# Patient Record
Sex: Female | Born: 1945 | Hispanic: No | Smoking: Never smoker
Health system: Southern US, Community
[De-identification: ages and names within clinical notes are randomized; demographics above are authoritative.]

## PROBLEM LIST (undated history)

## (undated) DIAGNOSIS — G4733 Obstructive sleep apnea (adult) (pediatric): Secondary | ICD-10-CM

## (undated) DIAGNOSIS — F329 Major depressive disorder, single episode, unspecified: Secondary | ICD-10-CM

## (undated) DIAGNOSIS — K219 Gastro-esophageal reflux disease without esophagitis: Secondary | ICD-10-CM

## (undated) DIAGNOSIS — I10 Essential (primary) hypertension: Secondary | ICD-10-CM

## (undated) DIAGNOSIS — F419 Anxiety disorder, unspecified: Secondary | ICD-10-CM

## (undated) DIAGNOSIS — R42 Dizziness and giddiness: Secondary | ICD-10-CM

## (undated) DIAGNOSIS — M5430 Sciatica, unspecified side: Secondary | ICD-10-CM

## (undated) DIAGNOSIS — H8109 Meniere's disease, unspecified ear: Secondary | ICD-10-CM

## (undated) DIAGNOSIS — F32A Depression, unspecified: Secondary | ICD-10-CM

## (undated) DIAGNOSIS — I2699 Other pulmonary embolism without acute cor pulmonale: Secondary | ICD-10-CM

## (undated) DIAGNOSIS — IMO0001 Reserved for inherently not codable concepts without codable children: Secondary | ICD-10-CM

## (undated) DIAGNOSIS — D759 Disease of blood and blood-forming organs, unspecified: Secondary | ICD-10-CM

## (undated) DIAGNOSIS — T4145XA Adverse effect of unspecified anesthetic, initial encounter: Secondary | ICD-10-CM

## (undated) DIAGNOSIS — M1990 Unspecified osteoarthritis, unspecified site: Secondary | ICD-10-CM

## (undated) DIAGNOSIS — T8859XA Other complications of anesthesia, initial encounter: Secondary | ICD-10-CM

## (undated) HISTORY — PX: FOOT SURGERY: SHX648

## (undated) HISTORY — PX: ABDOMINAL HYSTERECTOMY: SHX81

## (undated) HISTORY — PX: EYE SURGERY: SHX253

## (undated) HISTORY — PX: HAND SURGERY: SHX662

## (undated) HISTORY — PX: KNEE SURGERY: SHX244

## (undated) HISTORY — DX: Dizziness and giddiness: R42

## (undated) HISTORY — DX: Anxiety disorder, unspecified: F41.9

## (undated) HISTORY — PX: TONSILLECTOMY: SUR1361

## (undated) HISTORY — DX: Major depressive disorder, single episode, unspecified: F32.9

## (undated) HISTORY — DX: Meniere's disease, unspecified ear: H81.09

## (undated) HISTORY — DX: Unspecified osteoarthritis, unspecified site: M19.90

## (undated) HISTORY — DX: Obstructive sleep apnea (adult) (pediatric): G47.33

## (undated) HISTORY — DX: Depression, unspecified: F32.A

## (undated) HISTORY — DX: Gastro-esophageal reflux disease without esophagitis: K21.9

## (undated) HISTORY — PX: BOWEL RESECTION: SHX1257

---

## 2007-05-13 ENCOUNTER — Emergency Department (HOSPITAL_COMMUNITY): Admission: EM | Admit: 2007-05-13 | Discharge: 2007-05-13 | Payer: Self-pay | Admitting: Emergency Medicine

## 2007-05-23 ENCOUNTER — Ambulatory Visit (HOSPITAL_COMMUNITY): Admission: RE | Admit: 2007-05-23 | Discharge: 2007-05-23 | Payer: Self-pay | Admitting: Orthopedic Surgery

## 2007-06-25 ENCOUNTER — Encounter: Admission: RE | Admit: 2007-06-25 | Discharge: 2007-09-23 | Payer: Self-pay | Admitting: Orthopedic Surgery

## 2008-09-30 ENCOUNTER — Encounter: Admission: RE | Admit: 2008-09-30 | Discharge: 2008-09-30 | Payer: Self-pay | Admitting: Family Medicine

## 2009-09-10 ENCOUNTER — Encounter: Admission: RE | Admit: 2009-09-10 | Discharge: 2009-09-10 | Payer: Self-pay

## 2009-10-08 ENCOUNTER — Emergency Department (HOSPITAL_COMMUNITY): Admission: EM | Admit: 2009-10-08 | Discharge: 2009-10-08 | Payer: Self-pay | Admitting: Emergency Medicine

## 2009-10-10 ENCOUNTER — Emergency Department (HOSPITAL_COMMUNITY): Admission: EM | Admit: 2009-10-10 | Discharge: 2009-10-10 | Payer: Self-pay | Admitting: Emergency Medicine

## 2009-11-19 ENCOUNTER — Encounter: Admission: RE | Admit: 2009-11-19 | Discharge: 2009-11-19 | Payer: Self-pay | Admitting: Family Medicine

## 2010-01-11 ENCOUNTER — Ambulatory Visit: Payer: Self-pay | Admitting: Vascular Surgery

## 2010-01-11 ENCOUNTER — Emergency Department (HOSPITAL_COMMUNITY): Admission: EM | Admit: 2010-01-11 | Discharge: 2010-01-11 | Payer: Self-pay | Admitting: Emergency Medicine

## 2010-01-11 ENCOUNTER — Encounter (INDEPENDENT_AMBULATORY_CARE_PROVIDER_SITE_OTHER): Payer: Self-pay | Admitting: Emergency Medicine

## 2010-02-10 ENCOUNTER — Encounter: Admission: RE | Admit: 2010-02-10 | Discharge: 2010-02-10 | Payer: Self-pay | Admitting: Internal Medicine

## 2010-10-04 ENCOUNTER — Emergency Department (HOSPITAL_COMMUNITY): Payer: PRIVATE HEALTH INSURANCE

## 2010-10-04 ENCOUNTER — Emergency Department (HOSPITAL_COMMUNITY)
Admission: EM | Admit: 2010-10-04 | Discharge: 2010-10-05 | Disposition: A | Discharge status: Home / Self Care | Payer: PRIVATE HEALTH INSURANCE | Attending: Emergency Medicine | Admitting: Emergency Medicine

## 2010-10-04 DIAGNOSIS — M549 Dorsalgia, unspecified: Secondary | ICD-10-CM | POA: Insufficient documentation

## 2010-10-04 DIAGNOSIS — R0789 Other chest pain: Secondary | ICD-10-CM | POA: Insufficient documentation

## 2010-10-04 DIAGNOSIS — R42 Dizziness and giddiness: Secondary | ICD-10-CM | POA: Insufficient documentation

## 2010-10-04 DIAGNOSIS — M79609 Pain in unspecified limb: Secondary | ICD-10-CM | POA: Insufficient documentation

## 2010-10-04 DIAGNOSIS — I10 Essential (primary) hypertension: Secondary | ICD-10-CM | POA: Insufficient documentation

## 2010-10-04 LAB — BASIC METABOLIC PANEL
GFR calc Af Amer: >60 mL/min (ref >60)
GFR calc non Af Amer: >60 mL/min (ref >60)
Potassium: 4.4 mEq/L (ref 3.5–5.1)
Sodium: 137 mEq/L (ref 135–145)

## 2010-10-04 LAB — CK TOTAL AND CKMB (NOT AT ARMC)
CK, MB: 1.7 ng/mL (ref 0.3–4.0)
CK, MB: 1.6 ng/mL (ref 0.3–4.0)
CK, MB: 1.5 ng/mL (ref 0.3–4.0)
Relative Index: 1.4 (ref 0.0–2.5)
Relative Index: 1.3 (ref 0.0–2.5)
Total CK: 123 U/L (ref 7–177)
Total CK: 118 U/L (ref 7–177)

## 2010-10-04 LAB — CBC
MCH: 31.3 pg (ref 26.0–34.0)
MCHC: 34.1 g/dL (ref 30.0–36.0)
Platelets: 263 10*3/uL (ref 150–400)
RDW: 12.7 % (ref 11.5–15.5)
WBC: 5.9 10*3/uL (ref 4.0–10.5)

## 2010-10-04 LAB — URINALYSIS, ROUTINE W REFLEX MICROSCOPIC
Bilirubin Urine: NEGATIVE
Ketones, ur: NEGATIVE mg/dL
Nitrite: NEGATIVE
Specific Gravity, Urine: 1.013 (ref 1.005–1.030)
Urobilinogen, UA: 0.2 mg/dL (ref 0.0–1.0)
pH: 7 (ref 5.0–8.0)

## 2010-10-04 LAB — TROPONIN I: Troponin I: 0.02 ng/mL (ref 0.00–0.06)

## 2010-10-05 DIAGNOSIS — R072 Precordial pain: Secondary | ICD-10-CM

## 2010-10-27 DIAGNOSIS — K219 Gastro-esophageal reflux disease without esophagitis: Secondary | ICD-10-CM | POA: Insufficient documentation

## 2010-10-28 ENCOUNTER — Telehealth (INDEPENDENT_AMBULATORY_CARE_PROVIDER_SITE_OTHER): Payer: Self-pay | Admitting: *Deleted

## 2010-10-28 ENCOUNTER — Institutional Professional Consult (permissible substitution): Payer: PRIVATE HEALTH INSURANCE | Admitting: Pulmonary Disease

## 2010-10-31 ENCOUNTER — Telehealth: Payer: Self-pay | Admitting: Pulmonary Disease

## 2010-11-01 ENCOUNTER — Telehealth: Payer: Self-pay | Admitting: *Deleted

## 2010-11-01 NOTE — Telephone Encounter (Signed)
Phone Note  Call from Patient Call back at [facility] Phone [phone]   Caller: Patient Call For: clance Summary of Call: pt has called sleep center to cancel the sleep study that's scheduled for tonight as she just found out her ins. won't cover this. she will be on medicare in july and will rsc then.  Initial call taken by: Tivis Ringer, CNA,  October 28, 2010 3:17 PM  Follow-up for Phone Call         Will send to Dr. Shelle Iron as Melanie Pittman  October 28, 2010 3:18 PM   Additional Follow-up for Phone Call  Additional follow up Details #1::        i have never seen this pt before, and have no idea who ordered the sleep study.   Additional Follow-up by: Barbaraann Share MD,  October 28, 2010 5:01 PM    Additional Follow-up for Phone Call  Additional follow up Details #2::     Pt was scheduled to see kc today but no showed--lmomtcb x1 [name]  October 28, 2010 5:05 PM  lmomtcb x2 [name]  October 31, 2010 5:52 PM  lmomtcb x3 will sign off message per protocol informed pt if needed [place] for anything else to call and let us know . pt has apt 11/29/10 [name]  November 01, 2010 6:03 PM

## 2010-11-04 LAB — POCT I-STAT, CHEM 8
Creatinine, Ser: 0.3 mg/dL — ABNORMAL LOW (ref 0.4–1.2)
Glucose, Bld: 88 mg/dL (ref 70–99)
Hemoglobin: 13.3 g/dL (ref 12.0–15.0)
TCO2: 26 mmol/L (ref 0–100)

## 2010-11-10 NOTE — Progress Notes (Signed)
Summary: cancelled sleep stud- no show apt--lmomtcb x3  Phone Note Call from Patient Call back at [facility] Phone [phone]   Caller: Patient Call For: clance Summary of Call: pt has called sleep center to cancel the sleep study that's scheduled for tonight as she just found out her ins. won't cover this. she will be on medicare in july and will rsc then.  Initial call taken by: Tivis Ringer, CNA,  October 28, 2010 3:17 PM  Follow-up for Phone Call        Will send to Dr. Shelle Iron as Melanie Pittman  October 28, 2010 3:18 PM   Additional Follow-up for Phone Call Additional follow up Details #1::        i have never seen this pt before, and have no idea who ordered the sleep study.   Additional Follow-up by: Barbaraann Share MD,  October 28, 2010 5:01 PM    Additional Follow-up for Phone Call Additional follow up Details #2::    Pt was scheduled to see kc today but no showed--lmomtcb x1 [name]  October 28, 2010 5:05 PM  lmomtcb x2 [name]  October 31, 2010 5:52 PM  lmomtcb x3 will sign off message per protocol informed pt if needed [place] for anything else to call and let us know . pt has apt 11/29/10 [name]  November 01, 2010 6:03 PM  see epic

## 2010-11-10 NOTE — Progress Notes (Signed)
Summary: nos appt  Phone Note Call from Patient   Caller: [email or web address]  Call For: clance Summary of Call: Rsc nos from 3/16 to 4/17. Initial call taken by: Darletta Moll,  October 31, 2010 9:34 AM

## 2010-11-28 ENCOUNTER — Encounter: Payer: Self-pay | Admitting: Internal Medicine

## 2010-11-29 ENCOUNTER — Institutional Professional Consult (permissible substitution): Payer: PRIVATE HEALTH INSURANCE | Admitting: Pulmonary Disease

## 2010-12-27 NOTE — Op Note (Signed)
NAME:  Melanie Pittman, Melanie Pittman             ACCOUNT NO.:  [id number]   MEDICAL RECORD NO.:  [id number]          PATIENT TYPE:  AMB   LOCATION:  SDS                          FACILITY:  [facility]   PHYSICIAN:  Madelynn Done, MD  DATE OF BIRTH:  [birth year]   DATE OF PROCEDURE:  05/23/2007  DATE OF DISCHARGE:  05/23/2007                               OPERATIVE REPORT   PREOPERATIVE DIAGNOSIS:  Right small finger base of the metacarpal  fracture, intra-articular fracture, reverse Bennett fracture.   POSTOPERATIVE DIAGNOSIS:  Right small finger base of the metacarpal  fracture, intra-articular fracture, reverse Bennett fracture.   ATTENDING SURGEON:  Dr. Gilman Schmidt was scrubbed and present for the  entire procedure.   ASSISTANT SURGEON:  None.   PROCEDURE:  Percutaneous skeletal fixation of carpometacarpal fracture  subluxation, reverse Bennett fracture with manipulation.   SURGICAL IMPLANTS:  Two 0.045 K-wires.   TOURNIQUET TIME:  0 minutes.   ANESTHESIA:  General via LMA.   SURGICAL INDICATIONS:  Melanie Pittman is a 65 year old right-hand-  dominant female who sustained a fall earlier this week.  She was noted  to have a closed injury to her right small finger metacarpal base.  The  patient presented to the office this week with a obvious deformity and  fracture to the base of her small finger.  Risks, benefits and  alternatives discussed in detail with the patient and signed informed  consent was obtained to proceed.  Risks include but not limited to  bleeding, infection, nerve damage, nonunion, hardware failure, loss of  motion of the digit, need for further surgical intervention, dystrophy  of the hand and early joint arthrosis.   A signed informed consent was obtained on the day of procedure.   DESCRIPTION OF PROCEDURE:  The patient was properly identified in the  preoperative holding area and a mark with permanent marker was made on  the right small finger to indicate correct  operative site.  The patient  then brought back to the operating room, placed supine on the anesthesia  room table.  General anesthesia was administered.  The patient tolerated  this well.  The patient received preoperative antibiotics prior to any  skin incisions.  A well-padded tourniquet was then placed on the right  brachium and sealed with a 1000 drape.  The right upper extremity was  prepped with Betadine and sterilely draped.  Time-out was called, the  correct side was identified.  The procedure was then begun.  Using the  aid of the mini C-arm, a closed manipulation was then performed which  reduced the small finger metacarpal base fracture as well as the base of  the small finger CMC joint.  The reduction was confirmed using the mini  C-arm.  A K-wire was then driven from the small finger metacarpal shaft  into the ring finger metacarpal shaft.  The position was confirmed using  the mini C-arm.  An additional 0.045 K-wire was then driven across from  the metacarpal base region incision across the [facility] joint to hold the  base fracture in place.  Following placement of the  two K-wires, the  finger was then put through live mini C-arm fluoroscopy to confirm the  reduction and congruency of the base of the fifth CMC joint.  The K-  wires were then bent and cut and left out of the skin.  Pin caps were  then applied.  Xeroform was then applied around the pin caps.  The  patient was then placed in a sterile compressive dressing and a well-  padded ulnar gutter splint.  The patient was extubated and taken to  recovery room in good condition.   Intraoperative radiographs three views of the right hand do show the two  K-wires in place.  There is good reduction of the metacarpal base  fracture and reduction of the [facility] joint of the small finger.   POSTOPERATIVE PLAN:  The patient be seen back in the office in  approximately one week for splint change and application of an ulnar  gutter  cast, likely four weeks of immobilization, pins out the four-week  mark.  I plan to see her back at the one-week mark and the four-week  mark.  Then the pins out and then likely begin some motion.      Madelynn Done, MD  Electronically Signed     FWO/MEDQ  D:  05/23/2007  T:  05/24/2007  Job:  [id number]

## 2011-03-20 ENCOUNTER — Emergency Department (HOSPITAL_COMMUNITY): Payer: Medicare Other

## 2011-03-20 ENCOUNTER — Emergency Department (HOSPITAL_COMMUNITY)
Admission: EM | Admit: 2011-03-20 | Discharge: 2011-03-20 | Disposition: A | Discharge status: Home / Self Care | Payer: Medicare Other | Attending: Emergency Medicine | Admitting: Emergency Medicine

## 2011-03-20 DIAGNOSIS — K219 Gastro-esophageal reflux disease without esophagitis: Secondary | ICD-10-CM | POA: Insufficient documentation

## 2011-03-20 DIAGNOSIS — I10 Essential (primary) hypertension: Secondary | ICD-10-CM | POA: Insufficient documentation

## 2011-03-20 DIAGNOSIS — R109 Unspecified abdominal pain: Secondary | ICD-10-CM | POA: Insufficient documentation

## 2011-03-20 LAB — COMPREHENSIVE METABOLIC PANEL
Albumin: 3.5 g/dL (ref 3.5–5.2)
Alkaline Phosphatase: 93 U/L (ref 39–117)
BUN: 12 mg/dL (ref 6–23)
Calcium: 9.5 mg/dL (ref 8.4–10.5)
Potassium: 4.1 mEq/L (ref 3.5–5.1)
Total Protein: 7.3 g/dL (ref 6.0–8.3)

## 2011-03-20 LAB — DIFFERENTIAL
Basophils Absolute: 0 10*3/uL (ref 0.0–0.1)
Basophils Relative: 0 % (ref 0–1)
Eosinophils Relative: 3 % (ref 0–5)
Monocytes Absolute: 0.4 10*3/uL (ref 0.1–1.0)

## 2011-03-20 LAB — CBC
HCT: 38.2 % (ref 36.0–46.0)
MCH: 30.5 pg (ref 26.0–34.0)
MCHC: 33.2 g/dL (ref 30.0–36.0)
RDW: 12.8 % (ref 11.5–15.5)

## 2011-03-20 LAB — URINALYSIS, ROUTINE W REFLEX MICROSCOPIC
Bilirubin Urine: NEGATIVE
Leukocytes, UA: NEGATIVE
Nitrite: NEGATIVE
Specific Gravity, Urine: 1.005 (ref 1.005–1.030)
Urobilinogen, UA: 0.2 mg/dL (ref 0.0–1.0)
pH: 7 (ref 5.0–8.0)

## 2011-03-20 LAB — LIPASE, BLOOD: Lipase: 23 U/L (ref 11–59)

## 2011-05-25 LAB — BASIC METABOLIC PANEL
Calcium: 9.8
GFR calc Af Amer: >60
GFR calc non Af Amer: >60
Potassium: 3.6
Sodium: 138

## 2011-05-25 LAB — CBC
HCT: 38.9
Hemoglobin: 13.3
RBC: 4.25
WBC: 6.5

## 2012-03-17 ENCOUNTER — Encounter (HOSPITAL_COMMUNITY): Payer: Self-pay | Admitting: *Deleted

## 2012-03-17 ENCOUNTER — Emergency Department (HOSPITAL_COMMUNITY): Payer: Medicare Other

## 2012-03-17 ENCOUNTER — Emergency Department (HOSPITAL_COMMUNITY)
Admission: EM | Admit: 2012-03-17 | Discharge: 2012-03-17 | Disposition: A | Discharge status: Home / Self Care | Payer: Medicare Other | Attending: Emergency Medicine | Admitting: Emergency Medicine

## 2012-03-17 DIAGNOSIS — I10 Essential (primary) hypertension: Secondary | ICD-10-CM | POA: Insufficient documentation

## 2012-03-17 DIAGNOSIS — R209 Unspecified disturbances of skin sensation: Secondary | ICD-10-CM | POA: Insufficient documentation

## 2012-03-17 DIAGNOSIS — R42 Dizziness and giddiness: Secondary | ICD-10-CM

## 2012-03-17 DIAGNOSIS — H538 Other visual disturbances: Secondary | ICD-10-CM | POA: Insufficient documentation

## 2012-03-17 DIAGNOSIS — Z79899 Other long term (current) drug therapy: Secondary | ICD-10-CM | POA: Insufficient documentation

## 2012-03-17 HISTORY — DX: Essential (primary) hypertension: I10

## 2012-03-17 LAB — POCT I-STAT, CHEM 8
BUN: 13 mg/dL (ref 6–23)
Calcium, Ion: 1.18 mmol/L (ref 1.13–1.30)
Chloride: 107 mEq/L (ref 96–112)
Glucose, Bld: 93 mg/dL (ref 70–99)
HCT: 37 % (ref 36.0–46.0)
Potassium: 3.7 mEq/L (ref 3.5–5.1)

## 2012-03-17 NOTE — ED Notes (Signed)
Pt back from MRI 

## 2012-03-17 NOTE — ED Notes (Signed)
Patient transported to MRI 

## 2012-03-17 NOTE — ED Notes (Signed)
Pt taken to MRI  

## 2012-03-17 NOTE — ED Provider Notes (Signed)
History     CSN: [id number]  Arrival date & time 03/17/12  1424   First MD Initiated Contact with Patient 03/17/12 1515      Chief Complaint  Patient presents with  . Numbness    left side of face  . Dizziness  . Blurred Vision    left eye    (Consider location/radiation/quality/duration/timing/severity/associated sxs/prior treatment) HPI Developed dizziness i.e. sensation of room spinning onset yesterday has had several episodes lasting a few seconds each. Patient also reports blurred vision from left eye and feeling of congestion behind her left thigh and in her left ear for 3 weeks. This morning she had an episode lasting approximately 2 seconds she could not see or hear. Symptoms resolve spontaneously without treatment. Presently she only complains of congestion behind left eye and blurred vision in left eye as if "I'm looking through a haze." Visual changes in left eye have been present for 3 weeks, continuously. Nothing makes symptoms better or worse no treatment prior to coming here. No loss of consciousness no headache no fever no other associated symptoms. Past Medical History  Diagnosis Date  . GERD (gastroesophageal reflux disease)   . Hypertension    Mnire's disease. Diabetes Past Surgical History  Procedure Date  . Abdominal hysterectomy   . Knee surgery     No family history on file.  History  Substance Use Topics  . Smoking status: Never Smoker   . Smokeless tobacco: Not on file  . Alcohol Use: No    OB History    Grav Para Term Preterm Abortions TAB SAB Ect Mult Living                  Review of Systems  Constitutional: Negative.   HENT: Positive for hearing loss and congestion.   Eyes: Positive for visual disturbance.  Respiratory: Negative.   Cardiovascular: Negative.   Gastrointestinal: Negative.   Musculoskeletal: Negative.   Skin: Negative.   Neurological: Positive for dizziness.  Hematological: Negative.   Psychiatric/Behavioral:  Negative.   All other systems reviewed and are negative.    Allergies  Penicillins and Prednisone  Home Medications   Current Outpatient Rx  Name Route Sig Dispense Refill  . FUROSEMIDE 40 MG PO TABS Oral Take 40 mg by mouth daily.    Marland Kitchen HYDROCHLOROTHIAZIDE 25 MG PO TABS Oral Take 25 mg by mouth daily.      Marland Kitchen MECLIZINE HCL 25 MG PO TABS Oral Take 25 mg by mouth 3 (three) times daily as needed. Dizziness    . ADULT MULTIVITAMIN W/MINERALS CH Oral Take 1 tablet by mouth daily.      BP 138/81  Pulse 86  Temp 98.5 F (36.9 C)  Resp 18  Wt 223 lb (101.152 kg)  SpO2 97%  Physical Exam  Nursing note and vitals reviewed. Constitutional: She appears well-developed and well-nourished.  HENT:  Head: Normocephalic and atraumatic.  Eyes: Conjunctivae are normal. Pupils are equal, round, and reactive to light.  Neck: Neck supple. No tracheal deviation present. No thyromegaly present.       No bruit  Cardiovascular: Normal rate and regular rhythm.   No murmur heard. Pulmonary/Chest: Effort normal and breath sounds normal.  Abdominal: Soft. Bowel sounds are normal. She exhibits no distension. There is no tenderness.  Musculoskeletal: Normal range of motion. She exhibits no edema and no tenderness.  Neurological: She is alert. Coordination normal.       Gait normal Romberg normal coronary drift normal finger to  nose normal  Skin: Skin is warm and dry. No rash noted.  Psychiatric: She has a normal mood and affect. Her behavior is normal.    ED Course  Procedures (including critical care time   Date: 03/17/2012  Rate: 80  Rhythm: normal sinus rhythm  QRS Axis: left  Intervals: normal  ST/T Wave abnormalities: normal  Conduction Disutrbances:none  Narrative Interpretation:   Old EKG Reviewed: unchanged  Unchanged from 10/04/2010 as interpreted by me.  Results for orders placed during the hospital encounter of 03/17/12  POCT I-STAT, CHEM 8      Component Value Range   Sodium  141  135 - 145 mEq/L   Potassium 3.7  3.5 - 5.1 mEq/L   Chloride 107  96 - 112 mEq/L   BUN 13  6 - 23 mg/dL   Creatinine, Ser 1.61  0.50 - 1.10 mg/dL   Glucose, Bld 93  70 - 99 mg/dL   Calcium, Ion 0.96  0.45 - 1.30 mmol/L   TCO2 22  0 - 100 mmol/L   Hemoglobin 12.6  12.0 - 15.0 g/dL   HCT 40.9  81.1 - 91.4 %   Mr Brain Wo Contrast  03/17/2012  *RADIOLOGY REPORT*  Clinical Data: Left-sided facial numbness. Dizziness Blurred vision. Delete  MRI HEAD WITHOUT CONTRAST  Technique:  Multiplanar, multiecho pulse sequences of the brain and surrounding structures were obtained according to standard protocol without intravenous contrast.  Comparison: None.  Findings: The diffusion weighted images demonstrate no evidence for acute or subacute infarction.  No hemorrhage or mass lesion is present.  The ventricles are of normal size.  No significant extra- axial fluid collection is present.  Flow is present in the major intracranial arteries.  The globes orbits are intact.  The paranasal sinuses and mastoid air cells are clear.  IMPRESSION: Negative MRI of the brain.  Original Report Authenticated By: Jamesetta Orleans. MATTERN, M.D.    . Labs Reviewed - No data to display No results found.   No diagnosis found.  530 pm resting comforably no distrwess , gcss 15 ,  MDM  Doubt tia sx highly atypical . Blurred vision ongoing for several weeksm, suggest ophthomoology f/u Plan meclizine prn Dx Vertigo        Doug Sou, MD 03/17/12 1734

## 2012-03-17 NOTE — ED Notes (Signed)
Pt AAO NAD at this time.

## 2012-03-17 NOTE — ED Notes (Signed)
Patient is alert and oriented x3.  She is complaining of dizziness attacks that started yesterday that Have additional symptoms of blurred vision in the left eye and head ache with generalized weakness.   She states that the weakness is a signs of an on coming attack.  She currently denies any pain

## 2013-01-27 ENCOUNTER — Other Ambulatory Visit: Payer: Self-pay | Admitting: Family Medicine

## 2013-01-27 DIAGNOSIS — Z1231 Encounter for screening mammogram for malignant neoplasm of breast: Secondary | ICD-10-CM

## 2013-02-26 ENCOUNTER — Ambulatory Visit: Payer: Medicare Other

## 2013-03-20 ENCOUNTER — Ambulatory Visit
Admission: RE | Admit: 2013-03-20 | Discharge: 2013-03-20 | Disposition: A | Discharge status: Home / Self Care | Payer: Medicare Other | Source: Ambulatory Visit | Attending: Family Medicine | Admitting: Family Medicine

## 2013-03-20 DIAGNOSIS — Z1231 Encounter for screening mammogram for malignant neoplasm of breast: Secondary | ICD-10-CM

## 2013-05-12 ENCOUNTER — Encounter: Payer: Self-pay | Admitting: Neurology

## 2013-05-13 ENCOUNTER — Ambulatory Visit: Payer: Medicare Other | Admitting: Neurology

## 2013-05-19 ENCOUNTER — Ambulatory Visit: Payer: Medicare Other | Admitting: Neurology

## 2013-05-26 ENCOUNTER — Encounter: Payer: Self-pay | Admitting: Neurology

## 2013-05-26 ENCOUNTER — Ambulatory Visit (INDEPENDENT_AMBULATORY_CARE_PROVIDER_SITE_OTHER): Payer: Medicare Other | Admitting: Neurology

## 2013-05-26 VITALS — BP 123/76 | HR 80 | Ht 66.0 in | Wt 227.0 lb

## 2013-05-26 DIAGNOSIS — G609 Hereditary and idiopathic neuropathy, unspecified: Secondary | ICD-10-CM

## 2013-05-26 NOTE — Patient Instructions (Signed)
Overall you are doing fairly well but I do want to suggest a few things today:   As far as diagnostic testing:  I would like to get some blood work to check for potential causes of your symptoms  Please ask [facility] to send [place] copies of your MRI.  Please call us with any interim questions, concerns, problems, updates or refill requests.   Please also call us for any test results so we can go over those with you on the phone.  My clinical assistant and will answer any of your questions and relay your messages to me and also relay most of my messages to you.   Our phone number is [phone]. We also have an after hours call service for urgent matters and there is a physician on-call for urgent questions. For any emergencies you know to call 911 or go to the nearest emergency room

## 2013-05-26 NOTE — Progress Notes (Signed)
[facility]    Provider:  Dr Hosie Poisson Referring Provider: Barbaraann Barthel Fanny Dance, MD Primary Care Physician:  Beverley Fiedler, MD  CC: extremity numbness  HPI:  Melanie Pittman is a 67 y.o. female here as a referral from Dr. Barbaraann Barthel for numbness of extremities. Is from the elbow down to the fingers, starts with the 4th/5th digits on right hand. Complete lack of sensation. Can be one hand or another, will go completely numb from the hand to the elbow. Wakes up with heart palpitations. Shakes hands and symptoms improve over  5 to . Takes blood pressure and it can be elevated. Starting to have same symptoms with her legs now. Happens when she is lying down, about to go asleep. Denies any weakness, no loss of bowel/bladder control. No history of trauma to extremities or neck. No history of visual change/loss.   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 Reports also having had a MRI of the C spine which per the patient was normal   Review of Systems: Out of a complete 14 system review, the patient complains of only the following symptoms, and all other reviewed systems are negative. Positive for fatigue blurred vision numbness snoring dizziness cough palpitations ringing in ears joint pain joint swelling depression anxiety runny nose  History   Social History  . Marital Status: Single    Spouse Name: N/A    Number of Children: 4  . Years of Education: BA   Occupational History  .     Social History Main Topics  . Smoking status: Never Smoker   . Smokeless tobacco: Never Used  . Alcohol Use: Yes     Comment: wine 1-2 weekly   . Drug Use: No  . Sexual Activity: Not on file   Other Topics Concern  . Not on file   Social History Narrative   Patient lives at home alone.    Patient has her BA degree.    Patient is single.    Patient patient does not work.    Patient has 4 grown adult children.     Family  History  Problem Relation Age of Onset  . Colon polyps Father   . Arthritis Father   . Prostate cancer Father     Past Medical History  Diagnosis Date  . GERD (gastroesophageal reflux disease)   . Hypertension   . Arthritis     Past Surgical History  Procedure Laterality Date  . Abdominal hysterectomy    . Knee surgery Right   . Abdominal hysterectomy    . Bowel resection      Current Outpatient Prescriptions  Medication Sig Dispense Refill  . diclofenac (VOLTAREN) 75 MG EC tablet Take 75 mg by mouth 2 (two) times daily.      . fish oil-omega-3 fatty acids 1000 MG capsule Take 2 g by mouth daily.      Marland Kitchen lisinopril-hydrochlorothiazide (PRINZIDE,ZESTORETIC) 10-12.5 MG per tablet Take 1 tablet by mouth daily.      . meclizine (ANTIVERT) 25 MG tablet Take 25 mg by mouth 3 (three) times daily as needed. Dizziness      . Multiple Vitamin (MULTIVITAMIN WITH MINERALS) TABS Take 1 tablet by mouth daily.       No current facility-administered medications for this visit.    Allergies as of 05/26/2013 - Review Complete 05/26/2013  Allergen Reaction Noted  . Penicillins Rash   . Prednisone Palpitations     Vitals: BP 123/76  Pulse 80  Ht 5\' 6"  (1.676 m)  Wt 227 lb (102.967 kg)  BMI 36.66 kg/m2 Last Weight:  Wt Readings from Last 1 Encounters:  05/26/13 227 lb (102.967 kg)   Last Height:   Ht Readings from Last 1 Encounters:  05/26/13 5\' 6"  (1.676 m)     Physical exam: Exam: Gen: NAD, conversant Eyes: anicteric sclerae, moist conjunctivae HENT: Atraumatic, oropharynx clear Neck: Trachea midline; supple,  Lungs: CTA, no wheezing, rales, rhonic                          CV: RRR, no MRG Abdomen: Soft, non-tender;  Extremities: No peripheral edema  Skin: Normal temperature, no rash,  Psych: Appropriate affect, pleasant  Neuro: MS: AA&Ox3, appropriately interactive, normal affect   Speech: fluent w/o paraphasic error   CN: PERRL, EOMI no nystagmus, no ptosis,  sensation intact to LT V1-V3 bilat, face symmetric, no weakness, hearing grossly intact, palate elevates symmetrically, shoulder shrug 5/5 bilat,  tongue protrudes midline, no fasiculations noted.  Motor: normal bulk and tone Strength: 5/5  In all extremities  Coord: rapid alternating and point-to-point (FNF, HTS) movements intact.  Reflexes: symmetrical, bilat downgoing toes  Sens: LT intact in all extremities, Pain elicited with palpation of R elbow at medial epicondyle, is non-radiating. Negative Tinnels and Phalens sign.   Gait: posture, stance, stride and arm-swing normal.   Assessment:  After physical and neurologic examination, review of laboratory studies, imaging, neurophysiology testing and pre-existing records, assessment will be reviewed on the problem list.  Plan:  Treatment plan and additional workup will be reviewed under Problem List.  1)Numbness  67y/o with transient episodes of extremity numbness, predominantly effecting bilat UE but lately patient noticing it in lower extremities too. Physical exam pertinent only for mild tenderness to palpation over R medical epicondyle. 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 Unclear etiology of symptoms. Patient to get complete copy of old records, including imaging. Will check lab work for potential causes of peripheral neuropathy. With history of palpitations and extremity paresthesias would consider panic attacks. Follow up once workup completed.

## 2013-05-30 ENCOUNTER — Encounter: Payer: Self-pay | Admitting: Neurology

## 2013-05-30 LAB — METHYLMALONIC ACID, SERUM: Methylmalonic Acid: 78 nmol/L (ref 0–378)

## 2013-05-30 LAB — PROTEIN ELECTROPHORESIS
Albumin ELP: 3.6 g/dL (ref 3.2–5.6)
Alpha 1: 0.2 g/dL (ref 0.1–0.4)
Alpha 2: 0.6 g/dL (ref 0.4–1.2)
Beta: 1.1 g/dL (ref 0.6–1.3)
Gamma Globulin: 1 g/dL (ref 0.5–1.6)
Globulin, Total: 2.9 g/dL (ref 2.0–4.5)
Total Protein: 6.5 g/dL (ref 6.0–8.5)

## 2013-05-30 LAB — VITAMIN B1, WHOLE BLOOD: Thiamine: 122.1 nmol/L (ref 66.5–200.0)

## 2013-05-30 LAB — HGB A1C W/O EAG: Hgb A1c MFr Bld: 6.1 % — ABNORMAL HIGH (ref 4.8–5.6)

## 2013-05-30 LAB — VITAMIN B12: Vitamin B-12: 421 pg/mL (ref 211–946)

## 2013-05-30 LAB — ANA: Anti Nuclear Antibody(ANA): NEGATIVE

## 2013-05-30 LAB — CALCIUM: Calcium: 9.6 mg/dL (ref 8.6–10.2)

## 2014-01-21 ENCOUNTER — Ambulatory Visit: Payer: Medicare Other | Admitting: Dietician

## 2014-03-11 ENCOUNTER — Encounter: Payer: Medicare PPO | Attending: Gastroenterology | Admitting: Dietician

## 2014-03-11 ENCOUNTER — Encounter: Payer: Self-pay | Admitting: Dietician

## 2014-03-11 VITALS — Ht 66.5 in | Wt 226.0 lb

## 2014-03-11 DIAGNOSIS — E669 Obesity, unspecified: Secondary | ICD-10-CM

## 2014-03-11 DIAGNOSIS — I10 Essential (primary) hypertension: Secondary | ICD-10-CM

## 2014-03-11 DIAGNOSIS — Z6835 Body mass index (BMI) 35.0-35.9, adult: Secondary | ICD-10-CM | POA: Diagnosis not present

## 2014-03-11 DIAGNOSIS — Z713 Dietary counseling and surveillance: Secondary | ICD-10-CM | POA: Insufficient documentation

## 2014-03-11 NOTE — Patient Instructions (Addendum)
Plan:  1) Increase physical activity from 2-3 days a week to 3-4 days a week.  2) Decrease carbohydrate intake at breakfast- 2-3 servings and combine protein with the carbohydrate.  -Decrease juice intake and replace with water  3) Add in a 3rd evening meal or snack  4) Honor your hunger and fullness  -eat when hungry but stop when satisfied but not full

## 2014-03-11 NOTE — Progress Notes (Signed)
Medical Nutrition Therapy:  Appt start time: 1345 end time:  1445.   Assessment:  Primary concerns today: GI Problems - referral. Patient would like to lose weight. Feels really really tired after eating carbohydrates - feels that she needs to lay down after.- just started a couple of months ago. Symptoms have improved since GI visit. Patient wants to learn what she can do to better manage her weight. Patient reports that she has already made a number of changes to her diet, which was previously higher in fat and sugar. Currently, she eats only 2 meals a day, one that is excessive in carbohydrate, and drinks multiple servings of juice and sweet tea daily.   Preferred Learning Style:   No preference indicated   Learning Readiness:   Change in progress   MEDICATIONS: See chart   DIETARY INTAKE:  Usual eating pattern includes 2 meals and 0-1 snacks per day.  Everyday foods include Fish, tuna, salmon, chicken.  Avoided foods include: Lactose intolerant-tolerates yogurt, doesn't digest fatty foods very well. Gets very bloated when eats anything that has milk in it. Doesn't eat a lot of meat-difficulty digesting beef. Eats sourdough bread - doesn't feel good after eating regular bread.   24-hr recall:  Hot tea - sugar - 2 teaspoons and coconut milk B ( AM): Coffee - sugar 2 tsp & coconut milk or lactose free milk (sometimes doesn't tolerate), 1-2 slices toast with butter. Pureed mango - mango juice or apple juice - 10 oz- takes 30 minutes to drink it. Snk ( AM): 30 minutes to an hour after breakfast 3/4 cup- 1 cup yogurt with fruit and no drink or 2 boiled eggs with a cup of coffee with sugar L ( PM): none Snk ( PM): none D (2-4 PM): Salad-lettuce, tomatoes, olives, onions - basalmic vinegar and 2 vegetables, with roasted chicken or sometimes fish and sweet potato or mashed potato, green beans or collards, piece of bread. Sweet tea 20 oz with ice or coffee. Snk ( PM): crackers and a cup of  tea - usually one before bed Beverages: hot tea, coffee, sweet tea, mango or apple juice  Usual physical activity: [employer] - water aerobics 2-3 times a week and then will cycle for half an hour  - helps with ulcerated spine pain.   Estimated energy needs: 1800 calories 200 g carbohydrates 135 g protein 50 g fat  Progress Towards Goal(s):  No progress.   Nutritional Diagnosis:  [place]-3.3 Overweight/obesity As related to meal skipping and previous diet high in sugar and fat.  As evidenced by patient report and BMI greater than 30.    Intervention:  Nutrition Education & Counseling. Patient has eliminated certain foods from her diet that she realized were causing the stomach discomfort and bloating and has seen her symptoms improve. She wanted to focus on weight management today. We discussed the importance of not going long stretches of time in between eating occassions and eating when hungry. We also identified sources of carbohydrate, serving sizes and what is an adequate amount of carbohydrate at meals and snacks.   Plan:  1) Increase physical activity from 2-3 days a week to 3-4 days a week.  2) Decrease carbohydrate intake at breakfast- 2-3 servings and combine protein with the carbohydrate.  -Decrease juice intake and replace with water  3) Add in a 3rd evening meal or snack  4) Honor your hunger and fullness  -eat when hungry but stop when satisfied but not full  Teaching Method  Utilized:  Auditory   Handouts given during visit include:  Yellow care   Barriers to learning/adherence to lifestyle change: ulcerated spine - physical activity  Demonstrated degree of understanding via:  Teach Back   Monitoring/Evaluation:  Dietary intake, exercise, and body weight in 4 week(s).

## 2014-04-08 ENCOUNTER — Ambulatory Visit: Payer: Medicare PPO | Admitting: Dietician

## 2014-04-11 ENCOUNTER — Emergency Department (HOSPITAL_COMMUNITY)
Admission: EM | Admit: 2014-04-11 | Discharge: 2014-04-11 | Disposition: A | Discharge status: Home / Self Care | Payer: Medicare PPO | Attending: Emergency Medicine | Admitting: Emergency Medicine

## 2014-04-11 ENCOUNTER — Encounter (HOSPITAL_COMMUNITY): Payer: Self-pay | Admitting: Emergency Medicine

## 2014-04-11 DIAGNOSIS — IMO0002 Reserved for concepts with insufficient information to code with codable children: Secondary | ICD-10-CM | POA: Insufficient documentation

## 2014-04-11 DIAGNOSIS — E876 Hypokalemia: Secondary | ICD-10-CM | POA: Diagnosis not present

## 2014-04-11 DIAGNOSIS — I10 Essential (primary) hypertension: Secondary | ICD-10-CM | POA: Insufficient documentation

## 2014-04-11 DIAGNOSIS — M129 Arthropathy, unspecified: Secondary | ICD-10-CM | POA: Insufficient documentation

## 2014-04-11 DIAGNOSIS — F411 Generalized anxiety disorder: Secondary | ICD-10-CM | POA: Diagnosis not present

## 2014-04-11 DIAGNOSIS — K0889 Other specified disorders of teeth and supporting structures: Secondary | ICD-10-CM

## 2014-04-11 DIAGNOSIS — Z880 Allergy status to penicillin: Secondary | ICD-10-CM | POA: Diagnosis not present

## 2014-04-11 DIAGNOSIS — K089 Disorder of teeth and supporting structures, unspecified: Secondary | ICD-10-CM | POA: Diagnosis not present

## 2014-04-11 DIAGNOSIS — F419 Anxiety disorder, unspecified: Secondary | ICD-10-CM

## 2014-04-11 DIAGNOSIS — R51 Headache: Secondary | ICD-10-CM | POA: Insufficient documentation

## 2014-04-11 DIAGNOSIS — R000 Tachycardia, unspecified: Secondary | ICD-10-CM | POA: Insufficient documentation

## 2014-04-11 DIAGNOSIS — R259 Unspecified abnormal involuntary movements: Secondary | ICD-10-CM | POA: Diagnosis not present

## 2014-04-11 DIAGNOSIS — Z79899 Other long term (current) drug therapy: Secondary | ICD-10-CM | POA: Insufficient documentation

## 2014-04-11 LAB — CBC WITH DIFFERENTIAL/PLATELET
Basophils Absolute: 0 10*3/uL (ref 0.0–0.1)
Basophils Relative: 0 % (ref 0–1)
Eosinophils Absolute: 0.2 10*3/uL (ref 0.0–0.7)
Eosinophils Relative: 3 % (ref 0–5)
HEMATOCRIT: 39 % (ref 36.0–46.0)
Hemoglobin: 13.7 g/dL (ref 12.0–15.0)
LYMPHS ABS: 2 10*3/uL (ref 0.7–4.0)
LYMPHS PCT: 28 % (ref 12–46)
MCH: 31.8 pg (ref 26.0–34.0)
MCHC: 35.1 g/dL (ref 30.0–36.0)
MCV: 90.5 fL (ref 78.0–100.0)
MONO ABS: 0.5 10*3/uL (ref 0.1–1.0)
MONOS PCT: 7 % (ref 3–12)
NEUTROS ABS: 4.4 10*3/uL (ref 1.7–7.7)
Neutrophils Relative %: 62 % (ref 43–77)
Platelets: 256 10*3/uL (ref 150–400)
RBC: 4.31 MIL/uL (ref 3.87–5.11)
RDW: 12.8 % (ref 11.5–15.5)
WBC: 7.1 10*3/uL (ref 4.0–10.5)

## 2014-04-11 LAB — BASIC METABOLIC PANEL
Anion gap: 16 — ABNORMAL HIGH (ref 5–15)
BUN: 11 mg/dL (ref 6–23)
CO2: 20 meq/L (ref 19–32)
CREATININE: 0.51 mg/dL (ref 0.50–1.10)
Calcium: 9.4 mg/dL (ref 8.4–10.5)
Chloride: 104 mEq/L (ref 96–112)
GFR calc Af Amer: >90 mL/min (ref >90)
GFR calc non Af Amer: >90 mL/min (ref >90)
GLUCOSE: 120 mg/dL — AB (ref 70–99)
Potassium: 3.3 mEq/L — ABNORMAL LOW (ref 3.7–5.3)
Sodium: 140 mEq/L (ref 137–147)

## 2014-04-11 LAB — CBG MONITORING, ED: Glucose-Capillary: 109 mg/dL — ABNORMAL HIGH (ref 70–99)

## 2014-04-11 LAB — TROPONIN I: Troponin I: <0.3 ng/mL (ref <0.30)

## 2014-04-11 MED ORDER — CLINDAMYCIN HCL 150 MG PO CAPS: 450.0000 mg | ORAL_CAPSULE | Freq: Three times a day (TID) | ORAL | Status: DC

## 2014-04-11 MED ORDER — POTASSIUM CHLORIDE CRYS ER 20 MEQ PO TBCR
40.0000 meq | EXTENDED_RELEASE_TABLET | Freq: Once | ORAL | Status: AC
  Administered 2014-04-11: 40 meq via ORAL
  Filled 2014-04-11: qty 2

## 2014-04-11 MED ORDER — LORAZEPAM 2 MG/ML IJ SOLN
1.0000 mg | Freq: Once | INTRAMUSCULAR | Status: AC
  Administered 2014-04-11: 1 mg via INTRAVENOUS
  Filled 2014-04-11: qty 1

## 2014-04-11 MED ORDER — DIPHENHYDRAMINE HCL 50 MG/ML IJ SOLN
25.0000 mg | Freq: Once | INTRAMUSCULAR | Status: AC
  Administered 2014-04-11: 25 mg via INTRAVENOUS
  Filled 2014-04-11: qty 1

## 2014-04-11 NOTE — ED Provider Notes (Signed)
CSN: [id number]     Arrival date & time 04/11/14  1621 History   First MD Initiated Contact with Patient 04/11/14 1634     Chief Complaint  Patient presents with  . Headache  . Seizures     (Consider location/radiation/quality/duration/timing/severity/associated sxs/prior Treatment) HPI Patient is a 68 year old female with past medical history of hypertension, arthritis, GERD who presents today by EMS after being picked up at a dental clinic. Patient reports she is undergoing a tooth extraction, and when they broke one of her teeth to remove it, she noted a sharp pain in her left jaw which radiated up into the left side of her head. Patient states she immediately became anxious and immediately began having a trembling, stuttering sensation in her jaw. Arrival to the ER patient is still having trembling in her jaw with stuttering. Patient is able to answer questions in full, clear sentences and appropriately. Patient able to maintain, eye contact and carry out a conversation. Patient denies any numbness, weakness, dizziness, blurred vision, change in vision, loss of vision, chest pain, shortness of breath.   Past Medical History  Diagnosis Date  . GERD (gastroesophageal reflux disease)   . Hypertension   . Arthritis    Past Surgical History  Procedure Laterality Date  . Abdominal hysterectomy    . Knee surgery Right   . Abdominal hysterectomy    . Bowel resection    . Foot surgery    . Hand surgery     Family History  Problem Relation Age of Onset  . Colon polyps Father   . Arthritis Father   . Prostate cancer Father   . Heart disease Father   . Hyperlipidemia Father   . Hypertension Mother   . Cancer Mother   . Heart disease Sister   . Kidney disease Sister   . Kidney disease Son   . Diabetes Paternal Uncle   . Diabetes Cousin   . Stroke Other    History  Substance Use Topics  . Smoking status: Never Smoker   . Smokeless tobacco: Never Used  . Alcohol Use: Yes   Comment: wine 1-2 weekly    OB History   Grav Para Term Preterm Abortions TAB SAB Ect Mult Living                 Review of Systems  Constitutional: Negative for fever.  HENT: Positive for dental problem. Negative for trouble swallowing.   Eyes: Negative for visual disturbance.  Respiratory: Negative for shortness of breath.   Cardiovascular: Negative for chest pain.  Gastrointestinal: Negative for nausea, vomiting and abdominal pain.  Genitourinary: Negative for dysuria.  Musculoskeletal: Negative for neck pain.  Skin: Negative for rash.  Neurological: Positive for tremors. Negative for dizziness, weakness and numbness.  Psychiatric/Behavioral: The patient is nervous/anxious.       Allergies  Nutmeg oil (myristica oil); Hydrocodone; Penicillins; and Prednisone  Home Medications   Prior to Admission medications   Medication Sig Start Date End Date Taking? Authorizing Provider  diclofenac (VOLTAREN) 75 MG EC tablet Take 75 mg by mouth 2 (two) times daily.   Yes Historical Provider, MD  fish oil-omega-3 fatty acids 1000 MG capsule Take 2 g by mouth daily.   Yes Historical Provider, MD  lisinopril-hydrochlorothiazide (PRINZIDE,ZESTORETIC) 10-12.5 MG per tablet Take 1 tablet by mouth daily.   Yes Historical Provider, MD  meclizine (ANTIVERT) 25 MG tablet Take 25 mg by mouth 3 (three) times daily as needed. Dizziness   Yes Historical  Provider, MD  Multiple Vitamin (MULTIVITAMIN WITH MINERALS) TABS Take 1 tablet by mouth daily.   Yes Historical Provider, MD  clindamycin (CLEOCIN) 150 MG capsule Take 3 capsules (450 mg total) by mouth 3 (three) times daily. 04/11/14   Monte Fantasia, PA-C   BP 151/84  Pulse 90  Resp 20  SpO2 97% Physical Exam  Constitutional: She is oriented to person, place, and time. She appears well-developed and well-nourished. No distress.  Patient fully alert, sitting semi-Fowlers on the stretcher. Patient opens her eyes when spoken to, makes eye contact and  can answer questions appropriately in full, clear sentences. Patient appears anxious, and has trembling to her jaw which causes her to stutter.  HENT:  Head: Normocephalic and atraumatic.  Mouth/Throat: Uvula is midline, oropharynx is clear and moist and mucous membranes are normal. No oral lesions. No trismus in the jaw. No dental abscesses, uvula swelling or lacerations. No oropharyngeal exudate, posterior oropharyngeal edema, posterior oropharyngeal erythema or tonsillar abscesses.  Eyes: Conjunctivae and EOM are normal. Pupils are equal, round, and reactive to light. Right eye exhibits no discharge. Left eye exhibits no discharge. No scleral icterus. Right eye exhibits normal extraocular motion and no nystagmus. Left eye exhibits normal extraocular motion and no nystagmus.  Neck: Normal range of motion.  Cardiovascular: Regular rhythm, S1 normal, S2 normal, normal heart sounds and normal pulses.   No extrasystoles are present. Tachycardia present.   No murmur heard. Pulses:      Radial pulses are 2+ on the right side, and 2+ on the left side.       Dorsalis pedis pulses are 2+ on the right side, and 2+ on the left side.  Patient tachycardic to 120 on my initial exam.  Pulmonary/Chest: Effort normal and breath sounds normal. No respiratory distress.  Abdominal: Soft. There is no tenderness.  Musculoskeletal: Normal range of motion. She exhibits no edema and no tenderness.  Neurological: She is alert and oriented to person, place, and time. She has normal strength. No cranial nerve deficit or sensory deficit. Coordination and gait normal. GCS eye subscore is 4. GCS verbal subscore is 5. GCS motor subscore is 6.  Skin: Skin is warm and dry. No rash noted. She is not diaphoretic.  Psychiatric: Her mood appears anxious. She is agitated.    ED Course  Procedures (including critical care time) Labs Review Labs Reviewed  BASIC METABOLIC PANEL - Abnormal; Notable for the following:    Potassium  3.3 (*)    Glucose, Bld 120 (*)    Anion gap 16 (*)    All other components within normal limits  CBG MONITORING, ED - Abnormal; Notable for the following:    Glucose-Capillary 109 (*)    All other components within normal limits  CBC WITH DIFFERENTIAL  TROPONIN I    Imaging Review No results found.   EKG Interpretation   Date/Time:  Saturday April 11 2014 16:57:43 EDT Ventricular Rate:  111 PR Interval:  175 QRS Duration: 129 QT Interval:  364 QTC Calculation: 495 R Axis:   -66 Text Interpretation:  Sinus tachycardia Probable left atrial enlargement  RBBB and LAFB Non-specific ST-t changes Confirmed by Denton Lank  MD, Caryn Bee  ([id number]) on 04/11/2014 5:10:16 PM      MDM   Final diagnoses:  Hypokalemia  Anxiety  Pain, dental    Patient 68 year old female presenting to the ER by EMS dental pain, and jaw trembling which began acutely during a tooth extraction. Exam patient is alert,  maintaining eye contact, answering questions appropriately in full, clear sentences with trembling and dystonic movements noted to her jaw. Patient complaining of anxiousness and a intermittent headache in her left temporal region which she states is "subsiding at this time". We will treat patient's anxiety and possible dystonia with Ativan and Benadryl.  7:36 PM: Patient sleep in the bed this time. Vital signs are within normal limits with HR in the 80s. Patient states that she was undergoing a dental procedure, and when they "broke her tooth", she began experiencing a sharp pain that radiated from her left side of her jaw to her left temporal area. Patient states her pain feels "a little  better now".   Patient no longer having trembling of her jaw. Patient can speak in full, clear sentences without stuttering. Mood and affect are normal. No more evidence of anxiousness. Neuro exam remains benign. We will have patient followup with her dentist for the remainder for extraction on Monday. Patient is  agreeable to this plan.  Filed Vitals:   04/11/14 2026  BP: 151/84  Pulse:   Resp:      Signed,  Ladona Mow, PA-C 2:22 AM   This patient seen and discussed with Dr. Cathren Laine, MD    Monte Fantasia, PA-C 04/12/14 0222

## 2014-04-11 NOTE — ED Notes (Signed)
Pt arms and legs shaking but patient is alert

## 2014-04-11 NOTE — Discharge Instructions (Signed)
Panic Attacks °Panic attacks are sudden, short-lived surges of severe anxiety, fear, or discomfort. They may occur for no reason when you are relaxed, when you are anxious, or when you are sleeping. Panic attacks may occur for a number of reasons:  °· Healthy people occasionally have panic attacks in extreme, life-threatening situations, such as war or natural disasters. Normal anxiety is a protective mechanism of the body that helps us react to danger (fight or flight response). °· Panic attacks are often seen with anxiety disorders, such as panic disorder, social anxiety disorder, generalized anxiety disorder, and phobias. Anxiety disorders cause excessive or uncontrollable anxiety. They may interfere with your relationships or other life activities. °· Panic attacks are sometimes seen with other mental illnesses, such as depression and posttraumatic stress disorder. °· Certain medical conditions, prescription medicines, and drugs of abuse can cause panic attacks. °SYMPTOMS  °Panic attacks start suddenly, peak within 20 minutes, and are accompanied by four or more of the following symptoms: °· Pounding heart or fast heart rate (palpitations). °· Sweating. °· Trembling or shaking. °· Shortness of breath or feeling smothered. °· Feeling choked. °· Chest pain or discomfort. °· Nausea or strange feeling in your stomach. °· Dizziness, light-headedness, or feeling like you will faint. °· Chills or hot flushes. °· Numbness or tingling in your lips or hands and feet. °· Feeling that things are not real or feeling that you are not yourself. °· Fear of losing control or going crazy. °· Fear of dying. °Some of these symptoms can mimic serious medical conditions. For example, you may think you are having a heart attack. Although panic attacks can be very scary, they are not life threatening. °DIAGNOSIS  °Panic attacks are diagnosed through an assessment by your health care provider. Your health care provider will ask  questions about your symptoms, such as where and when they occurred. Your health care provider will also ask about your medical history and use of alcohol and drugs, including prescription medicines. Your health care provider may order blood tests or other studies to rule out a serious medical condition. Your health care provider may refer you to a mental health professional for further evaluation. °TREATMENT  °· Most healthy people who have one or two panic attacks in an extreme, life-threatening situation will not require treatment. °· The treatment for panic attacks associated with anxiety disorders or other mental illness typically involves counseling with a mental health professional, medicine, or a combination of both. Your health care provider will help determine what treatment is best for you. °· Panic attacks due to physical illness usually go away with treatment of the illness. If prescription medicine is causing panic attacks, talk with your health care provider about stopping the medicine, decreasing the dose, or substituting another medicine. °· Panic attacks due to alcohol or drug abuse go away with abstinence. Some adults need professional help in order to stop drinking or using drugs. °HOME CARE INSTRUCTIONS  °· Take all medicines as directed by your health care provider.   °· Schedule and attend follow-up visits as directed by your health care provider. It is important to keep all your appointments. °SEEK MEDICAL CARE IF: °· You are not able to take your medicines as prescribed. °· Your symptoms do not improve or get worse. °SEEK IMMEDIATE MEDICAL CARE IF:  °· You experience panic attack symptoms that are different than your usual symptoms. °· You have serious thoughts about hurting yourself or others. °· You are taking medicine for panic attacks and   have a serious side effect. MAKE SURE YOU:  Understand these instructions.  Will watch your condition.  Will get help right away if you are not  doing well or get worse. Document Released: 07/31/2005 Document Revised: 08/05/2013 Document Reviewed: 03/14/2013 [facility] Patient Information 2015 [place], [place]. This information is not intended to replace advice given to you by your health care provider. Make sure you discuss any questions you have with your health care provider.   Generalized Anxiety Disorder Generalized anxiety disorder (GAD) is a mental disorder. It interferes with life functions, including relationships, work, and school. GAD is different from normal anxiety, which everyone experiences at some point in their lives in response to specific life events and activities. Normal anxiety actually helps [place] prepare for and get through these life events and activities. Normal anxiety goes away after the event or activity is over.  GAD causes anxiety that is not necessarily related to specific events or activities. It also causes excess anxiety in proportion to specific events or activities. The anxiety associated with GAD is also difficult to control. GAD can vary from mild to severe. People with severe GAD can have intense waves of anxiety with physical symptoms (panic attacks).  SYMPTOMS The anxiety and worry associated with GAD are difficult to control. This anxiety and worry are related to many life events and activities and also occur more days than not for 6 months or longer. People with GAD also have three or more of the following symptoms (one or more in children):  Restlessness.   Fatigue.  Difficulty concentrating.   Irritability.  Muscle tension.  Difficulty sleeping or unsatisfying sleep. DIAGNOSIS GAD is diagnosed through an assessment by your health care provider. Your health care provider will ask you questions aboutyour mood,physical symptoms, and events in your life. Your health care provider may ask you about your medical history and use of alcohol or drugs, including prescription medicines. Your health  care provider may also do a physical exam and blood tests. Certain medical conditions and the use of certain substances can cause symptoms similar to those associated with GAD. Your health care provider may refer you to a mental health specialist for further evaluation. TREATMENT The following therapies are usually used to treat GAD:   Medication. Antidepressant medication usually is prescribed for long-term daily control. Antianxiety medicines may be added in severe cases, especially when panic attacks occur.   Talk therapy (psychotherapy). Certain types of talk therapy can be helpful in treating GAD by providing support, education, and guidance. A form of talk therapy called cognitive behavioral therapy can teach you healthy ways to think about and react to daily life events and activities.  Stress managementtechniques. These include yoga, meditation, and exercise and can be very helpful when they are practiced regularly. A mental health specialist can help determine which treatment is best for you. Some people see improvement with one therapy. However, other people require a combination of therapies. Document Released: 11/25/2012 Document Revised: 12/15/2013 Document Reviewed: 11/25/2012 [facility] Patient Information 2015 [place], [place]. This information is not intended to replace advice given to you by your health care provider. Make sure you discuss any questions you have with your health care provider.   Hypokalemia Hypokalemia means that the amount of potassium in the blood is lower than normal.Potassium is a chemical, called an electrolyte, that helps regulate the amount of fluid in the body. It also stimulates muscle contraction and helps nerves function properly.Most of the body's potassium is inside of cells,  and only a very small amount is in the blood. Because the amount in the blood is so small, minor changes can be life-threatening. CAUSES  Antibiotics.  Diarrhea or  vomiting.  Using laxatives too much, which can cause diarrhea.  Chronic kidney disease.  Water pills (diuretics).  Eating disorders (bulimia).  Low magnesium level.  Sweating a lot. SIGNS AND SYMPTOMS  Weakness.  Constipation.  Fatigue.  Muscle cramps.  Mental confusion.  Skipped heartbeats or irregular heartbeat (palpitations).  Tingling or numbness. DIAGNOSIS  Your health care provider can diagnose hypokalemia with blood tests. In addition to checking your potassium level, your health care provider may also check other lab tests. TREATMENT Hypokalemia can be treated with potassium supplements taken by mouth or adjustments in your current medicines. If your potassium level is very low, you may need to get potassium through a vein (IV) and be monitored in the hospital. A diet high in potassium is also helpful. Foods high in potassium are:  Nuts, such as peanuts and pistachios.  Seeds, such as sunflower seeds and pumpkin seeds.  Peas, lentils, and lima beans.  Whole grain and bran cereals and breads.  Fresh fruit and vegetables, such as apricots, avocado, bananas, cantaloupe, kiwi, oranges, tomatoes, asparagus, and potatoes.  Orange and tomato juices.  Red meats.  Fruit yogurt. HOME CARE INSTRUCTIONS  Take all medicines as prescribed by your health care provider.  Maintain a healthy diet by including nutritious food, such as fruits, vegetables, nuts, whole grains, and lean meats.  If you are taking a laxative, be sure to follow the directions on the label. SEEK MEDICAL CARE IF:  Your weakness gets worse.  You feel your heart pounding or racing.  You are vomiting or having diarrhea.  You are diabetic and having trouble keeping your blood glucose in the normal range. SEEK IMMEDIATE MEDICAL CARE IF:  You have chest pain, shortness of breath, or dizziness.  You are vomiting or having diarrhea for more than 2 days.  You faint. MAKE SURE YOU:    Understand these instructions.  Will watch your condition.  Will get help right away if you are not doing well or get worse. Document Released: 07/31/2005 Document Revised: 05/21/2013 Document Reviewed: 01/31/2013 [facility] Patient Information 2015 [place], [place]. This information is not intended to replace advice given to you by your health care provider. Make sure you discuss any questions you have with your health care provider.   Dental Pain A tooth ache may be caused by cavities (tooth decay). Cavities expose the nerve of the tooth to air and hot or cold temperatures. It may come from an infection or abscess (also called a boil or furuncle) around your tooth. It is also often caused by dental caries (tooth decay). This causes the pain you are having. DIAGNOSIS  Your caregiver can diagnose this problem by exam. TREATMENT   If caused by an infection, it may be treated with medications which kill germs (antibiotics) and pain medications as prescribed by your caregiver. Take medications as directed.  Only take over-the-counter or prescription medicines for pain, discomfort, or fever as directed by your caregiver.  Whether the tooth ache today is caused by infection or dental disease, you should see your dentist as soon as possible for further care. SEEK MEDICAL CARE IF: The exam and treatment you received today has been provided on an emergency basis only. This is not a substitute for complete medical or dental care. If your problem worsens or new problems (symptoms)  appear, and you are unable to meet with your dentist, call or return to this location. SEEK IMMEDIATE MEDICAL CARE IF:   You have a fever.  You develop redness and swelling of your face, jaw, or neck.  You are unable to open your mouth.  You have severe pain uncontrolled by pain medicine. MAKE SURE YOU:   Understand these instructions.  Will watch your condition.  Will get help right away if you are not doing  well or get worse. Document Released: 07/31/2005 Document Revised: 10/23/2011 Document Reviewed: 03/18/2008 [facility] Patient Information 2015 [place], [place]. This information is not intended to replace advice given to you by your health care provider. Make sure you discuss any questions you have with your health care provider.

## 2014-04-11 NOTE — ED Notes (Addendum)
Pt arrived from dental clinic via EMS,Per EMS, pt was in the middle of a tooth extraction when she developed a sudden L sided HA after having R sided tooth numbing. Procedure was unable to be completed. Pt was given 1000 Tylenol en route per dentist request. Pt is having uncontrollable R arm tremors.  Pt is A&O and in NAD

## 2014-04-11 NOTE — ED Notes (Signed)
Pt shaking but able to speak when shaking.

## 2014-04-11 NOTE — ED Notes (Signed)
PA at bedside.

## 2014-04-11 NOTE — ED Notes (Addendum)
Pt from a free dental clinic via [facility] c/o  Jaw trembling while having work done on tooth #31 and having a headache to the front part of the head. Upon arrival to hospital pt began having seizure like activity. Pt alert and oriented. She has been standing in line the past two days trying to be worked in to the clinic. She has had a poor appetite over the past several days. She denies any other symptoms at this time.

## 2014-04-13 MED ORDER — CLINDAMYCIN HCL 300 MG PO CAPS: 150.0000 mg | ORAL_CAPSULE | Freq: Three times a day (TID) | ORAL | Status: DC

## 2014-04-13 NOTE — ED Provider Notes (Signed)
Medical screening examination/treatment/procedure(s) were conducted as a shared visit with non-physician practitioner(s) and myself.  I personally evaluated the patient during the encounter.   EKG Interpretation   Date/Time:  Saturday April 11 2014 16:57:43 EDT Ventricular Rate:  111 PR Interval:  175 QRS Duration: 129 QT Interval:  364 QTC Calculation: 495 R Axis:   -66 Text Interpretation:  Sinus tachycardia Probable left atrial enlargement  RBBB and LAFB Non-specific ST-t changes Confirmed by Denton Lank  MD, Caryn Bee  ([id number]) on 04/11/2014 5:10:16 PM      Pt with hx anxiety attacks, states at dentist about to have tooth pulled. When about to be injected w local, pt became very anxious/shaky/tremulous, then gradual onset frontal headache. Headache resolved. When arrived to ed mildly tachy, anxious. Symptoms gradually/completedly resolved. Hr 82 rr 16, no headache. Pt states feels much improved and ready for d/c.  Suzi Roots, MD 04/13/14 [phone]

## 2014-05-27 ENCOUNTER — Ambulatory Visit: Payer: Medicare PPO | Admitting: Dietician

## 2014-06-05 ENCOUNTER — Encounter (HOSPITAL_COMMUNITY): Payer: Self-pay | Admitting: Emergency Medicine

## 2014-06-05 ENCOUNTER — Inpatient Hospital Stay (HOSPITAL_COMMUNITY)
Admission: EM | Admit: 2014-06-05 | Discharge: 2014-06-08 | DRG: 291 | Disposition: A | Discharge status: Home / Self Care | Payer: Medicare PPO | Attending: Internal Medicine | Admitting: Internal Medicine

## 2014-06-05 ENCOUNTER — Emergency Department (HOSPITAL_COMMUNITY): Payer: Medicare PPO

## 2014-06-05 DIAGNOSIS — Z888 Allergy status to other drugs, medicaments and biological substances status: Secondary | ICD-10-CM | POA: Diagnosis not present

## 2014-06-05 DIAGNOSIS — R0789 Other chest pain: Secondary | ICD-10-CM | POA: Diagnosis present

## 2014-06-05 DIAGNOSIS — K5900 Constipation, unspecified: Secondary | ICD-10-CM | POA: Diagnosis present

## 2014-06-05 DIAGNOSIS — I110 Hypertensive heart disease with heart failure: Principal | ICD-10-CM | POA: Diagnosis present

## 2014-06-05 DIAGNOSIS — Z880 Allergy status to penicillin: Secondary | ICD-10-CM

## 2014-06-05 DIAGNOSIS — Z6833 Body mass index (BMI) 33.0-33.9, adult: Secondary | ICD-10-CM | POA: Diagnosis not present

## 2014-06-05 DIAGNOSIS — I5032 Chronic diastolic (congestive) heart failure: Secondary | ICD-10-CM

## 2014-06-05 DIAGNOSIS — E669 Obesity, unspecified: Secondary | ICD-10-CM | POA: Diagnosis present

## 2014-06-05 DIAGNOSIS — K219 Gastro-esophageal reflux disease without esophagitis: Secondary | ICD-10-CM | POA: Diagnosis present

## 2014-06-05 DIAGNOSIS — I10 Essential (primary) hypertension: Secondary | ICD-10-CM | POA: Diagnosis present

## 2014-06-05 DIAGNOSIS — M1990 Unspecified osteoarthritis, unspecified site: Secondary | ICD-10-CM | POA: Diagnosis present

## 2014-06-05 DIAGNOSIS — Z79899 Other long term (current) drug therapy: Secondary | ICD-10-CM

## 2014-06-05 DIAGNOSIS — I2699 Other pulmonary embolism without acute cor pulmonale: Secondary | ICD-10-CM | POA: Diagnosis present

## 2014-06-05 DIAGNOSIS — R079 Chest pain, unspecified: Secondary | ICD-10-CM

## 2014-06-05 LAB — PRO B NATRIURETIC PEPTIDE: PRO B NATRI PEPTIDE: 65.6 pg/mL (ref 0–125)

## 2014-06-05 LAB — CBC
HCT: 38.6 % (ref 36.0–46.0)
HEMOGLOBIN: 13.3 g/dL (ref 12.0–15.0)
MCH: 31.3 pg (ref 26.0–34.0)
MCHC: 34.5 g/dL (ref 30.0–36.0)
MCV: 90.8 fL (ref 78.0–100.0)
Platelets: 236 10*3/uL (ref 150–400)
RBC: 4.25 MIL/uL (ref 3.87–5.11)
RDW: 12.5 % (ref 11.5–15.5)
WBC: 7.6 10*3/uL (ref 4.0–10.5)

## 2014-06-05 LAB — BASIC METABOLIC PANEL
Anion gap: 14 (ref 5–15)
BUN: 12 mg/dL (ref 6–23)
CALCIUM: 10.2 mg/dL (ref 8.4–10.5)
CO2: 23 meq/L (ref 19–32)
Chloride: 101 mEq/L (ref 96–112)
Creatinine, Ser: 0.6 mg/dL (ref 0.50–1.10)
GFR calc Af Amer: >90 mL/min (ref >90)
GFR calc non Af Amer: >90 mL/min (ref >90)
GLUCOSE: 100 mg/dL — AB (ref 70–99)
Potassium: 3.6 mEq/L — ABNORMAL LOW (ref 3.7–5.3)
Sodium: 138 mEq/L (ref 137–147)

## 2014-06-05 LAB — URINALYSIS, ROUTINE W REFLEX MICROSCOPIC
BILIRUBIN URINE: NEGATIVE
Glucose, UA: NEGATIVE mg/dL
Hgb urine dipstick: NEGATIVE
KETONES UR: NEGATIVE mg/dL
Leukocytes, UA: NEGATIVE
NITRITE: NEGATIVE
PROTEIN: NEGATIVE mg/dL
Specific Gravity, Urine: 1.005 (ref 1.005–1.030)
UROBILINOGEN UA: 0.2 mg/dL (ref 0.0–1.0)
pH: 6.5 (ref 5.0–8.0)

## 2014-06-05 LAB — HEPATIC FUNCTION PANEL
ALT: 19 U/L (ref 0–35)
AST: 30 U/L (ref 0–37)
Albumin: 3.8 g/dL (ref 3.5–5.2)
Alkaline Phosphatase: 105 U/L (ref 39–117)
Bilirubin, Direct: <0.2 mg/dL (ref 0.0–0.3)
TOTAL PROTEIN: 7.9 g/dL (ref 6.0–8.3)
Total Bilirubin: 0.4 mg/dL (ref 0.3–1.2)

## 2014-06-05 LAB — APTT: APTT: 29 s (ref 24–37)

## 2014-06-05 LAB — PROTIME-INR
INR: 1.07 (ref 0.00–1.49)
Prothrombin Time: 14 seconds (ref 11.6–15.2)

## 2014-06-05 LAB — D-DIMER, QUANTITATIVE (NOT AT ARMC): D DIMER QUANT: 15.97 ug{FEU}/mL — AB (ref 0.00–0.48)

## 2014-06-05 LAB — LIPASE, BLOOD: Lipase: 29 U/L (ref 11–59)

## 2014-06-05 LAB — I-STAT TROPONIN, ED: TROPONIN I, POC: 0.01 ng/mL (ref 0.00–0.08)

## 2014-06-05 MED ORDER — ONDANSETRON HCL 4 MG PO TABS: 4.0000 mg | ORAL_TABLET | Freq: Four times a day (QID) | ORAL | Status: DC | PRN

## 2014-06-05 MED ORDER — ACETAMINOPHEN 650 MG RE SUPP
650.0000 mg | Freq: Four times a day (QID) | RECTAL | Status: DC | PRN
Start: 2014-06-05 — End: 2014-06-08

## 2014-06-05 MED ORDER — ONDANSETRON HCL 4 MG/2ML IJ SOLN: 4.0000 mg | Freq: Four times a day (QID) | INTRAMUSCULAR | Status: DC | PRN

## 2014-06-05 MED ORDER — MAGNESIUM CITRATE PO SOLN: 1.0000 | Freq: Once | ORAL | Status: AC | PRN

## 2014-06-05 MED ORDER — IOHEXOL 350 MG/ML SOLN
100.0000 mL | Freq: Once | INTRAVENOUS | Status: AC | PRN
  Administered 2014-06-05: 100 mL via INTRAVENOUS

## 2014-06-05 MED ORDER — LISINOPRIL 20 MG PO TABS
20.0000 mg | ORAL_TABLET | Freq: Every day | ORAL | Status: DC
  Administered 2014-06-06 – 2014-06-08 (×3): 20 mg via ORAL
  Filled 2014-06-05: qty 2
  Filled 2014-06-05 (×2): qty 1

## 2014-06-05 MED ORDER — ACETAMINOPHEN 325 MG PO TABS
650.0000 mg | ORAL_TABLET | Freq: Four times a day (QID) | ORAL | Status: DC | PRN
  Administered 2014-06-05 – 2014-06-06 (×3): 650 mg via ORAL
  Filled 2014-06-05 (×3): qty 2

## 2014-06-05 MED ORDER — HEPARIN (PORCINE) IN NACL 100-0.45 UNIT/ML-% IJ SOLN
1350.0000 [IU]/h | INTRAMUSCULAR | Status: DC
  Administered 2014-06-05: 1350 [IU]/h via INTRAVENOUS
  Filled 2014-06-05: qty 250

## 2014-06-05 MED ORDER — SODIUM CHLORIDE 0.9 % IJ SOLN
3.0000 mL | Freq: Two times a day (BID) | INTRAMUSCULAR | Status: DC
  Administered 2014-06-05: 3 mL via INTRAVENOUS

## 2014-06-05 MED ORDER — OXYCODONE HCL 5 MG PO TABS
5.0000 mg | ORAL_TABLET | ORAL | Status: DC | PRN
  Administered 2014-06-06 – 2014-06-07 (×2): 5 mg via ORAL
  Filled 2014-06-05 (×3): qty 1

## 2014-06-05 MED ORDER — SODIUM CHLORIDE 0.9 % IV SOLN
INTRAVENOUS | Status: DC
  Administered 2014-06-05 – 2014-06-07 (×3): via INTRAVENOUS

## 2014-06-05 MED ORDER — HYDROCHLOROTHIAZIDE 12.5 MG PO CAPS
12.5000 mg | ORAL_CAPSULE | Freq: Every day | ORAL | Status: DC
  Administered 2014-06-06 – 2014-06-08 (×3): 12.5 mg via ORAL
  Filled 2014-06-05 (×3): qty 1

## 2014-06-05 MED ORDER — BISACODYL 10 MG RE SUPP: 10.0000 mg | Freq: Every day | RECTAL | Status: DC | PRN

## 2014-06-05 MED ORDER — LISINOPRIL-HYDROCHLOROTHIAZIDE 20-12.5 MG PO TABS: 1.0000 | ORAL_TABLET | Freq: Every day | ORAL | Status: DC

## 2014-06-05 MED ORDER — MORPHINE SULFATE 2 MG/ML IJ SOLN: 1.0000 mg | INTRAMUSCULAR | Status: DC | PRN

## 2014-06-05 MED ORDER — VITAMIN B-1 100 MG PO TABS
100.0000 mg | ORAL_TABLET | Freq: Every day | ORAL | Status: DC
  Administered 2014-06-05 – 2014-06-08 (×4): 100 mg via ORAL
  Filled 2014-06-05 (×4): qty 1

## 2014-06-05 MED ORDER — ZOLPIDEM TARTRATE 5 MG PO TABS
5.0000 mg | ORAL_TABLET | Freq: Every evening | ORAL | Status: DC | PRN
Start: 2014-06-05 — End: 2014-06-08

## 2014-06-05 MED ORDER — HEPARIN BOLUS VIA INFUSION
2300.0000 [IU] | Freq: Once | INTRAVENOUS | Status: AC
  Administered 2014-06-05: 2300 [IU] via INTRAVENOUS
  Filled 2014-06-05: qty 2300

## 2014-06-05 MED ORDER — ASPIRIN 81 MG PO CHEW
324.0000 mg | CHEWABLE_TABLET | Freq: Once | ORAL | Status: AC
  Administered 2014-06-05: 324 mg via ORAL
  Filled 2014-06-05: qty 4

## 2014-06-05 MED ORDER — SENNA 8.6 MG PO TABS
1.0000 | ORAL_TABLET | Freq: Two times a day (BID) | ORAL | Status: DC
  Administered 2014-06-06 – 2014-06-08 (×4): 8.6 mg via ORAL
  Filled 2014-06-05 (×5): qty 1

## 2014-06-05 MED ORDER — HEPARIN BOLUS VIA INFUSION
2400.0000 [IU] | Freq: Once | INTRAVENOUS | Status: DC
  Filled 2014-06-05: qty 2400

## 2014-06-05 MED ORDER — FOLIC ACID 1 MG PO TABS
1.0000 mg | ORAL_TABLET | Freq: Every day | ORAL | Status: DC
  Administered 2014-06-05 – 2014-06-08 (×4): 1 mg via ORAL
  Filled 2014-06-05 (×4): qty 1

## 2014-06-05 MED ORDER — PANTOPRAZOLE SODIUM 40 MG PO TBEC
40.0000 mg | DELAYED_RELEASE_TABLET | Freq: Every day | ORAL | Status: DC
  Administered 2014-06-05 – 2014-06-08 (×4): 40 mg via ORAL
  Filled 2014-06-05 (×4): qty 1

## 2014-06-05 NOTE — Progress Notes (Addendum)
ANTICOAGULATION CONSULT NOTE - Initial Consult  Pharmacy Consult for:  IV Heparin Indication:  Pulmonary embolism  Allergies  Allergen Reactions  . Nutmeg Oil (Myristica Oil) Itching  . Hydrocodone Palpitations  . Penicillins Rash  . Prednisone Palpitations    Patient Measurements: 06/05/14 -- Height 67 inches  Weight 97.5 kg Heparin Dosing Weight:  83.2 kg  Vital Signs: Temp: 97.6 F (36.4 C) (10/23 1145) Temp Source: Oral (10/23 1145) BP: 119/66 mmHg (10/23 1827) Pulse Rate: 85 (10/23 1827)  Labs:  Recent Labs  06/05/14 1237  HGB 13.3  HCT 38.6  PLT 236  CREATININE 0.60   The estimated CrCl is 80.8 ml/min using SCr 0.6 and the Cockroft-Gault equation.  Medical History: Past Medical History  Diagnosis Date  . GERD (gastroesophageal reflux disease)   . Hypertension   . Arthritis     Medications:  Pending  Assessment: Asked to assist with IV Heparin therapy for this 68 year-old female with an acute PE, with CT evidence of right heartstrain consistent with at least a submassive PE.   Goals of Therapy:   Heparin level 0.3-0.7 units/ml  Monitor platelets by anticoagulation protocol: Yes   Plan:   Obtain baseline PT/INR and aPTT.  Begin with Heparin 2300 units bolus, then an infusion at 1350 units/hr.  Draw Heparin level 6 hours after starting the infusion and daily, and CBC daily while on Heparin.  Polo Rileylaire Presli Fanguy R.Ph. 06/05/2014,7:37 PM

## 2014-06-05 NOTE — ED Provider Notes (Signed)
CSN: [id number]     Arrival date & time 06/05/14  1137 History   First MD Initiated Contact with Patient 06/05/14 1458     Chief Complaint  Patient presents with  . Shortness of Breath  . Chest Pain     (Consider location/radiation/quality/duration/timing/severity/associated sxs/prior Treatment) HPI 68 year old female presents with 2 episodes of exertional dyspnea followed by sharp chest pain. She was walking to the gym and noticed that she was much more out of breath than normal. This got better with rest. She decided not to do a workout but still had this exertional dyspnea when walking back to her car. Called her doctor who recommended she come to the ED. For 2 hours she felt like she had sharp chest pain afterwards it seemed to come into her left neck. The patient felt full nauseous on the drive over. The pain was about a 3 or 4/10 and nothing made it better or worse. Denies any numbness or chills. Patient did note that she had left leg pain and swelling about one week ago after walking with church. The swelling has resolved. No prior history of blood clots.  Past Medical History  Diagnosis Date  . GERD (gastroesophageal reflux disease)   . Hypertension   . Arthritis    Past Surgical History  Procedure Laterality Date  . Abdominal hysterectomy    . Knee surgery Right   . Abdominal hysterectomy    . Bowel resection    . Foot surgery    . Hand surgery     Family History  Problem Relation Age of Onset  . Colon polyps Father   . Arthritis Father   . Prostate cancer Father   . Heart disease Father   . Hyperlipidemia Father   . Hypertension Mother   . Cancer Mother   . Heart disease Sister   . Kidney disease Sister   . Kidney disease Son   . Diabetes Paternal Uncle   . Diabetes Cousin   . Stroke Other    History  Substance Use Topics  . Smoking status: Never Smoker   . Smokeless tobacco: Never Used  . Alcohol Use: Yes     Comment: wine 1-2 weekly    OB History    Grav Para Term Preterm Abortions TAB SAB Ect Mult Living                 Review of Systems  Constitutional: Negative for fever and diaphoresis.  Respiratory: Positive for cough and shortness of breath.   Cardiovascular: Positive for chest pain and leg swelling.  Gastrointestinal: Positive for nausea. Negative for vomiting and abdominal pain.  Musculoskeletal: Negative for back pain.  All other systems reviewed and are negative.     Allergies  Nutmeg oil (myristica oil); Hydrocodone; Penicillins; and Prednisone  Home Medications   Prior to Admission medications   Medication Sig Start Date End Date Taking? Authorizing Provider  Coenzyme Q10 (CO Q 10 PO) Take 1 tablet by mouth daily.   Yes Historical Provider, MD  fish oil-omega-3 fatty acids 1000 MG capsule Take 1 g by mouth daily.    Yes Historical Provider, MD  lisinopril-hydrochlorothiazide (PRINZIDE,ZESTORETIC) 20-12.5 MG per tablet Take 1 tablet by mouth daily.   Yes Historical Provider, MD  Multiple Vitamin (MULTIVITAMIN WITH MINERALS) TABS Take 1 tablet by mouth daily.   Yes Historical Provider, MD   BP 173/100  Pulse 114  Temp(Src) 97.6 F (36.4 C) (Oral)  Resp 27  SpO2 95% Physical Exam  Vitals reviewed. Constitutional: She is oriented to person, place, and time. She appears well-developed and well-nourished.  HENT:  Head: Normocephalic and atraumatic.  Right Ear: External ear normal.  Left Ear: External ear normal.  Nose: Nose normal.  Eyes: Right eye exhibits no discharge. Left eye exhibits no discharge.  Cardiovascular: Normal rate, regular rhythm and normal heart sounds.   Pulmonary/Chest: Effort normal and breath sounds normal. She has no wheezes. She has no rales.  Abdominal: Soft. There is tenderness (mild generalized tenderness).  Musculoskeletal: She exhibits no edema and no tenderness.  Neurological: She is alert and oriented to person, place, and time.  Skin: Skin is warm and dry.    ED Course   Procedures (including critical care time) Labs Review Labs Reviewed  BASIC METABOLIC PANEL - Abnormal; Notable for the following:    Potassium 3.6 (*)    Glucose, Bld 100 (*)    All other components within normal limits  D-DIMER, QUANTITATIVE - Abnormal; Notable for the following:    D-Dimer, Quant 15.97 (*)    All other components within normal limits  CBC  PRO B NATRIURETIC PEPTIDE  HEPATIC FUNCTION PANEL  LIPASE, BLOOD  URINALYSIS, ROUTINE W REFLEX MICROSCOPIC  PROTIME-INR  APTT  I-STAT TROPOININ, ED    Imaging Review Ct Angio Chest Pe W/cm &/or Wo Cm  06/05/2014   CLINICAL DATA:  68 year old female with acute onset of shortness of breath with associated Sharp left-sided chest pain radiating into the left jaw. Some associated nausea earlier this morning.  EXAM: CT ANGIOGRAPHY CHEST WITH CONTRAST  TECHNIQUE: Multidetector CT imaging of the chest was performed using the standard protocol during bolus administration of intravenous contrast. Multiplanar CT image reconstructions and MIPs were obtained to evaluate the vascular anatomy.  CONTRAST:  OMNIPAQUE IOHEXOL 350 MG/ML SOLN  COMPARISON:  No priors.  FINDINGS: Mediastinum: Large volume of clot in the distal main pulmonary arteries bilaterally extending into lobar, segmental and subsegmental sized pulmonary artery branches bilaterally. No central saddle embolus is noted. Pulmonic trunk is mildly dilated measuring up to 3.1 cm. Right ventricle appears mildly dilated right ventricle is slightly prominent measuring up to 4.4 cm in diameter. Left ventricular diameter is up to 4.3 cm on today's non gated CT examination. RV to LV ratio of 1.02. No pericardial fluid, thickening or pericardial calcification. Numerous enlarged and borderline enlarged mediastinal and bilateral hilar lymph nodes. Specific examples include AP window lymph node measuring 1 cm, low right paratracheal lymph node measuring 13 mm, left hilar lymph node measuring 11  mm, and a subcarinal lymph node measuring 11 mm. Small hiatal hernia.  Lungs/Pleura: In the perihilar aspect of the right lung intimately associated with the major fissure (image 48 of series 4) there is a smoothly marginated 12 x 21 mm nodular lesion, which the other represent a nodule in the right middle or lower lobe, or could simply represent an enlarged interlobar lymph node. No other suspicious appearing pulmonary nodules or masses are noted. No acute consolidative airspace disease. No pleural effusions.  Upper Abdomen: Unremarkable.  Musculoskeletal: There are no aggressive appearing lytic or blastic lesions noted in the visualized portions of the skeleton.  Review of the MIP images confirms the above findings.  IMPRESSION: 1. Positive for acute PE with CT evidence of right heartstrain (RV/LV Ratio = 1.02) consistent with at least submassive (intermediate risk) PE. The presence of right heart strain has been associated with anincreased risk of morbidity and mortality. Consultation with Pulmonary and Critical  Care Medicine is recommended. 2. The patient has extensive mediastinal and bilateral hilar lymphadenopathy. In addition, there is a nodular opacity in the perihilar aspect of the right lung measuring 12 x 21 mm. It is uncertain whether or not this represents a pulmonary nodule, or is simply an enlarged interlobar lymph node (which is favored). The overall appearance is favored to reflect a lymphoproliferative disorder, and clinical correlation is recommended. Follow-up imaging is suggested to ensure resolution of these findings after appropriate medical therapy. Critical Value/emergent results were called by telephone at the time of interpretation on 06/05/2014 at 7:30 pm to Dr. Pricilla LovelessSCOTT Kishana Battey, who verbally acknowledged these results.   Electronically Signed   By: Trudie Reedaniel  Entrikin M.D.   On: 06/05/2014 19:30   Dg Chest Port 1 View  06/05/2014   CLINICAL DATA:  Shortness of breath and chest pain since  this a.m. History of rapid heart rate.  EXAM: PORTABLE CHEST - 1 VIEW  COMPARISON:  08/04/2011  FINDINGS: Prominent right hilar vascular structures but similar to the previous examination. There is no evidence for frank pulmonary edema. Negative for airspace disease. Heart and mediastinum are within normal limits.  IMPRESSION: Prominent central vascular structures, particularly on the right side. Findings could represent vascular congestion. There is not frank pulmonary edema.   Electronically Signed   By: Richarda OverlieAdam  Henn M.D.   On: 06/05/2014 12:25     EKG Interpretation   Date/Time:  Friday June 05 2014 11:49:06 EDT Ventricular Rate:  121 PR Interval:  162 QRS Duration: 115 QT Interval:  344 QTC Calculation: 488 R Axis:   -64 Text Interpretation:  Sinus tachycardia Probable left atrial enlargement  RBBB and LAFB Baseline wander in lead(s) II III aVF since last tracing no  significant change Confirmed by Juleen ChinaKOHUT  MD, STEPHEN ([id number]) on 06/05/2014  12:31:21 PM      MDM   Final diagnoses:  Chest pain  Pulmonary embolism    Patient's workup is consistent with a large PE. She is currently asymptomatic. Mild tachypnea but no other signs of increased WOB. HDS. No hypoxia. Will start on heparin and admit to hospitalist. I do not feel she needs TPA at this time. Will admit to stepdown.     Audree CamelScott T Madison Albea, MD 06/06/14 [phone]

## 2014-06-05 NOTE — Progress Notes (Signed)
Placed pt. On cpap. Pt. States auto titrate 4-13 are her home settings. Pt. Tolerating well at this time.

## 2014-06-05 NOTE — Progress Notes (Signed)
Pt has sleep apnea. Pt states she uses cpap qhs. Called md on call awaiting cal back.

## 2014-06-05 NOTE — ED Notes (Signed)
Pt c/o SOB starting today with sharp left sided chest pain radiating to left jaw, denies abdominal pain, states she did have episode of nausea this morning.

## 2014-06-05 NOTE — H&P (Signed)
[facility] History and Physical  Melanie Fillersrnis Reifsteck ZOX:[id number] DOB: [birth year] DOA: 06/05/2014  Referring physician: Pricilla LovelessScott Goldston, MD PCP: Beverley FiedlerANKINS,VICTORIA, MD   Chief Complaint: Shortness of Breath  HPI: Melanie Pittman is a 68 y.o. female presents with shortness of breath. She states that she was going to the gym today and then suddenly developed shortness of breath with exertion and at rest. She noted some type of heart pain in her chest. She states that she has no wheeze noted no cough noted. She states on Sunday she walked a lot and noted at that time that her calf was hurting but did not think too much about it other than massaging it. She states that she has not travelled anywhere no plane ride or car ride. She has only hypertension and has no prior history of clots.   Review of Systems:  Constitutional:  No weight loss, night sweats, Fevers, chills, fatigue.  HEENT:  No headaches, Difficulty swallowing,Tooth/dental problems,Sore throat,  No sneezing, itching, ear ache, nasal congestion, post nasal drip,  Cardio-vascular:  ++chest pain, no Orthopnea, PND, swelling in lower extremities, anasarca, dizziness  GI:  No heartburn, indigestion, abdominal pain, nausea, vomiting, diarrhea, change in bowel habits, loss of appetite  Resp:  ++shortness of breath with exertion and rest. No coughing up of blood.No wheezing  Skin:  no rash or lesions GU:  no dysuria, change in color of urine, no urgency or frequency  Musculoskeletal:  No joint pain or swelling. No decreased range of motion  Psych:  No change in mood or affect. No depression or anxiety. No memory loss.   Past Medical History  Diagnosis Date  . GERD (gastroesophageal reflux disease)   . Hypertension   . Arthritis    Past Surgical History  Procedure Laterality Date  . Abdominal hysterectomy    . Knee surgery Right   . Abdominal hysterectomy    . Bowel resection    . Foot surgery    . Hand surgery      Social History:  reports that she has never smoked. She has never used smokeless tobacco. She reports that she drinks alcohol. She reports that she does not use illicit drugs.  Allergies  Allergen Reactions  . Nutmeg Oil (Myristica Oil) Itching  . Hydrocodone Palpitations  . Penicillins Rash  . Prednisone Palpitations    Family History  Problem Relation Age of Onset  . Colon polyps Father   . Arthritis Father   . Prostate cancer Father   . Heart disease Father   . Hyperlipidemia Father   . Hypertension Mother   . Cancer Mother   . Heart disease Sister   . Kidney disease Sister   . Kidney disease Son   . Diabetes Paternal Uncle   . Diabetes Cousin   . Stroke Other      Prior to Admission medications   Medication Sig Start Date End Date Taking? Authorizing Provider  Coenzyme Q10 (CO Q 10 PO) Take 1 tablet by mouth daily.   Yes Historical Provider, MD  fish oil-omega-3 fatty acids 1000 MG capsule Take 1 g by mouth daily.    Yes Historical Provider, MD  lisinopril-hydrochlorothiazide (PRINZIDE,ZESTORETIC) 20-12.5 MG per tablet Take 1 tablet by mouth daily.   Yes Historical Provider, MD  Multiple Vitamin (MULTIVITAMIN WITH MINERALS) TABS Take 1 tablet by mouth daily.   Yes Historical Provider, MD   Physical Exam: Filed Vitals:   06/05/14 1245 06/05/14 1715 06/05/14 1800 06/05/14 1827  BP:  119/66  Pulse: 114 88 83 85  Temp:      TempSrc:      Resp: 27 18 22 20   SpO2: 95% 99% 94% 95%    Wt Readings from Last 3 Encounters:  03/11/14 102.513 kg (226 lb)  05/26/13 102.967 kg (227 lb)  03/17/12 101.152 kg (223 lb)    General:  Appears calm and comfortable Eyes: PERRL, normal lids, irises & conjunctiva ENT: grossly normal hearing, lips & tongue Neck: no LAD, masses or thyromegaly Cardiovascular: RRR, no m/r/g. No LE edema. Telemetry: SR, no arrhythmias  Respiratory: CTA bilaterally, no w/r/r. Normal respiratory effort. Abdomen: soft, ntnd Skin: no rash or  induration seen on limited exam Musculoskeletal: grossly normal tone BUE/BLE Psychiatric: grossly normal mood and affect, speech fluent and appropriate Neurologic: grossly non-focal.          Labs on Admission:  Basic Metabolic Panel:  Recent Labs Lab 06/05/14 1237  NA 138  K 3.6*  CL 101  CO2 23  GLUCOSE 100*  BUN 12  CREATININE 0.60  CALCIUM 10.2   Liver Function Tests:  Recent Labs Lab 06/05/14 1620  AST 30  ALT 19  ALKPHOS 105  BILITOT 0.4  PROT 7.9  ALBUMIN 3.8    Recent Labs Lab 06/05/14 1620  LIPASE 29   No results found for this basename: AMMONIA,  in the last 168 hours CBC:  Recent Labs Lab 06/05/14 1237  WBC 7.6  HGB 13.3  HCT 38.6  MCV 90.8  PLT 236   Cardiac Enzymes: No results found for this basename: CKTOTAL, CKMB, CKMBINDEX, TROPONINI,  in the last 168 hours  BNP (last 3 results)  Recent Labs  06/05/14 1237  PROBNP 65.6   CBG: No results found for this basename: GLUCAP,  in the last 168 hours  Radiological Exams on Admission: Ct Angio Chest Pe W/cm &/or Wo Cm  06/05/2014   CLINICAL DATA:  68 year old female with acute onset of shortness of breath with associated Sharp left-sided chest pain radiating into the left jaw. Some associated nausea earlier this morning.  EXAM: CT ANGIOGRAPHY CHEST WITH CONTRAST  TECHNIQUE: Multidetector CT imaging of the chest was performed using the standard protocol during bolus administration of intravenous contrast. Multiplanar CT image reconstructions and MIPs were obtained to evaluate the vascular anatomy.  CONTRAST:  OMNIPAQUE IOHEXOL 350 MG/ML SOLN  COMPARISON:  No priors.  FINDINGS: Mediastinum: Large volume of clot in the distal main pulmonary arteries bilaterally extending into lobar, segmental and subsegmental sized pulmonary artery branches bilaterally. No central saddle embolus is noted. Pulmonic trunk is mildly dilated measuring up to 3.1 cm. Right ventricle appears mildly dilated right  ventricle is slightly prominent measuring up to 4.4 cm in diameter. Left ventricular diameter is up to 4.3 cm on today's non gated CT examination. RV to LV ratio of 1.02. No pericardial fluid, thickening or pericardial calcification. Numerous enlarged and borderline enlarged mediastinal and bilateral hilar lymph nodes. Specific examples include AP window lymph node measuring 1 cm, low right paratracheal lymph node measuring 13 mm, left hilar lymph node measuring 11 mm, and a subcarinal lymph node measuring 11 mm. Small hiatal hernia.  Lungs/Pleura: In the perihilar aspect of the right lung intimately associated with the major fissure (image 48 of series 4) there is a smoothly marginated 12 x 21 mm nodular lesion, which the other represent a nodule in the right middle or lower lobe, or could simply represent an enlarged interlobar lymph node. No other  suspicious appearing pulmonary nodules or masses are noted. No acute consolidative airspace disease. No pleural effusions.  Upper Abdomen: Unremarkable.  Musculoskeletal: There are no aggressive appearing lytic or blastic lesions noted in the visualized portions of the skeleton.  Review of the MIP images confirms the above findings.  IMPRESSION: 1. Positive for acute PE with CT evidence of right heartstrain (RV/LV Ratio = 1.02) consistent with at least submassive (intermediate risk) PE. The presence of right heart strain has been associated with anincreased risk of morbidity and mortality. Consultation with Pulmonary and Critical Care Medicine is recommended. 2. The patient has extensive mediastinal and bilateral hilar lymphadenopathy. In addition, there is a nodular opacity in the perihilar aspect of the right lung measuring 12 x 21 mm. It is uncertain whether or not this represents a pulmonary nodule, or is simply an enlarged interlobar lymph node (which is favored). The overall appearance is favored to reflect a lymphoproliferative disorder, and clinical correlation  is recommended. Follow-up imaging is suggested to ensure resolution of these findings after appropriate medical therapy. Critical Value/emergent results were called by telephone at the time of interpretation on 06/05/2014 at 7:30 pm to Dr. Pricilla LovelessSCOTT GOLDSTON, who verbally acknowledged these results.   Electronically Signed   By: Trudie Reedaniel  Entrikin M.D.   On: 06/05/2014 19:30   Dg Chest Port 1 View  06/05/2014   CLINICAL DATA:  Shortness of breath and chest pain since this a.m. History of rapid heart rate.  EXAM: PORTABLE CHEST - 1 VIEW  COMPARISON:  08/04/2011  FINDINGS: Prominent right hilar vascular structures but similar to the previous examination. There is no evidence for frank pulmonary edema. Negative for airspace disease. Heart and mediastinum are within normal limits.  IMPRESSION: Prominent central vascular structures, particularly on the right side. Findings could represent vascular congestion. There is not frank pulmonary edema.   Electronically Signed   By: Richarda OverlieAdam  Henn M.D.   On: 06/05/2014 12:25     Assessment/Plan Active Problems:   G E R D   Pulmonary embolism   Hypertension   1. Pulmonary Embolism -she will be admitted to step down as there may be some right heart strain -will start on heparin -will get LE doppler -will use oxygen as needed -get echo to assess RV  2. Hypertension -will continue with home medications -monitor pressures  3. GERD -currently stable -PPI as needed    Code Status: Full Code (must indicate code status--if unknown or must be presumed, indicate so) DVT Prophylaxis:On Heprain Family Communication: None(indicate person spoken with, if applicable, with phone number if by telephone) Disposition Plan: Home (indicate anticipated LOS)  Time spent: 70min  [facility] PLL[facility],Brecklyn Galvis A [facility] Pager [phone]

## 2014-06-06 DIAGNOSIS — I10 Essential (primary) hypertension: Secondary | ICD-10-CM

## 2014-06-06 LAB — CBC
HEMATOCRIT: 33.7 % — AB (ref 36.0–46.0)
Hemoglobin: 12.3 g/dL (ref 12.0–15.0)
MCH: 32.5 pg (ref 26.0–34.0)
MCHC: 36.5 g/dL — ABNORMAL HIGH (ref 30.0–36.0)
MCV: 89.2 fL (ref 78.0–100.0)
PLATELETS: 227 10*3/uL (ref 150–400)
RBC: 3.78 MIL/uL — AB (ref 3.87–5.11)
RDW: 12.5 % (ref 11.5–15.5)
WBC: 6.2 10*3/uL (ref 4.0–10.5)

## 2014-06-06 LAB — COMPREHENSIVE METABOLIC PANEL
ALT: 15 U/L (ref 0–35)
AST: 17 U/L (ref 0–37)
Albumin: 3.4 g/dL — ABNORMAL LOW (ref 3.5–5.2)
Alkaline Phosphatase: 100 U/L (ref 39–117)
Anion gap: 12 (ref 5–15)
BILIRUBIN TOTAL: 0.5 mg/dL (ref 0.3–1.2)
BUN: 11 mg/dL (ref 6–23)
CALCIUM: 9.4 mg/dL (ref 8.4–10.5)
CHLORIDE: 99 meq/L (ref 96–112)
CO2: 24 meq/L (ref 19–32)
Creatinine, Ser: 0.59 mg/dL (ref 0.50–1.10)
GLUCOSE: 120 mg/dL — AB (ref 70–99)
Potassium: 3.6 mEq/L — ABNORMAL LOW (ref 3.7–5.3)
Sodium: 135 mEq/L — ABNORMAL LOW (ref 137–147)
Total Protein: 6.9 g/dL (ref 6.0–8.3)

## 2014-06-06 LAB — GLUCOSE, CAPILLARY: Glucose-Capillary: 107 mg/dL — ABNORMAL HIGH (ref 70–99)

## 2014-06-06 LAB — MRSA PCR SCREENING: MRSA by PCR: NEGATIVE

## 2014-06-06 LAB — HEPARIN LEVEL (UNFRACTIONATED)
Heparin Unfractionated: 0.37 IU/mL (ref 0.30–0.70)
Heparin Unfractionated: 0.61 IU/mL (ref 0.30–0.70)

## 2014-06-06 LAB — HEMOGLOBIN A1C
Hgb A1c MFr Bld: 6.1 % — ABNORMAL HIGH (ref <5.7)
MEAN PLASMA GLUCOSE: 128 mg/dL — AB (ref <117)

## 2014-06-06 LAB — TSH: TSH: 3.47 u[IU]/mL (ref 0.350–4.500)

## 2014-06-06 MED ORDER — NAPHAZOLINE HCL 0.1 % OP SOLN
2.0000 [drp] | Freq: Four times a day (QID) | OPHTHALMIC | Status: DC | PRN
Start: 2014-06-06 — End: 2014-06-08
  Filled 2014-06-06: qty 15

## 2014-06-06 MED ORDER — HEPARIN (PORCINE) IN NACL 100-0.45 UNIT/ML-% IJ SOLN
1450.0000 [IU]/h | INTRAMUSCULAR | Status: DC
  Administered 2014-06-06: 1450 [IU]/h via INTRAVENOUS
  Filled 2014-06-06 (×3): qty 250

## 2014-06-06 NOTE — Progress Notes (Signed)
[facility] PROGRESS NOTE  Melanie Pittman UJW:[id number] DOB: 11/29/1945 DOA: 06/05/2014 PCP: Beverley FiedlerANKINS,VICTORIA, MD  Assessment/Plan: 1-SOB and CP: due to PE -continue heparin drip -continue IVF's and supportive care -will follow 2-D echo and Le duplex -meanwhile bed rest with bedside commode  2-HTN: stable and well controlled -will continue current antihypertensive regimen  3-GERD: continue PPI  4-constipation: continue senna  Code Status: Full Family Communication: no family at bedside Disposition Plan: will transfer to telemetry    Consultants:  None   Procedures:  CT angio: with positive PE  2-D echo: pending  LE duplex: pending   Antibiotics:  None   HPI/Subjective: Feeling better, complaining of pain on her left calf and mild soreness in her chest. VVS and denies SOB  Objective: Filed Vitals:   06/06/14 1300  BP: 119/74  Pulse: 74  Temp:   Resp: 18    Intake/Output Summary (Last 24 hours) at 06/06/14 1353 Last data filed at 06/06/14 1300  Gross per 24 hour  Intake 983.66 ml  Output   1400 ml  Net -416.34 ml   Filed Weights   06/05/14 1954  Weight: 97.523 kg (215 lb)    Exam:   General:  Feeling better, mild chest soreness and pain on her left calf. No fever and denies SOB  Cardiovascular: RRR, no rubs or gallops  Respiratory: CTA bilaterally  Abdomen: soft, NT, ND, positive BS  Musculoskeletal: no rubs or gallops  Data Reviewed: Basic Metabolic Panel:  Recent Labs Lab 06/05/14 1237 06/06/14 0253  NA 138 135*  K 3.6* 3.6*  CL 101 99  CO2 23 24  GLUCOSE 100* 120*  BUN 12 11  CREATININE 0.60 0.59  CALCIUM 10.2 9.4   Liver Function Tests:  Recent Labs Lab 06/05/14 1620 06/06/14 0253  AST 30 17  ALT 19 15  ALKPHOS 105 100  BILITOT 0.4 0.5  PROT 7.9 6.9  ALBUMIN 3.8 3.4*    Recent Labs Lab 06/05/14 1620  LIPASE 29   CBC:  Recent Labs Lab 06/05/14 1237 06/06/14 0253  WBC 7.6 6.2  HGB 13.3 12.3   HCT 38.6 33.7*  MCV 90.8 89.2  PLT 236 227   BNP (last 3 results)  Recent Labs  06/05/14 1237  PROBNP 65.6   CBG:  Recent Labs Lab 06/06/14 0810  GLUCAP 107*    Recent Results (from the past 240 hour(s))  MRSA PCR SCREENING     Status: None   Collection Time    06/05/14  9:37 PM      Result Value Ref Range Status   MRSA by PCR NEGATIVE  NEGATIVE Final   Comment:            The GeneXpert MRSA Assay (FDA     approved for NASAL specimens     only), is one component of a     comprehensive MRSA colonization     surveillance program. It is not     intended to diagnose MRSA     infection nor to guide or     monitor treatment for     MRSA infections.     Studies: Ct Angio Chest Pe W/cm &/or Wo Cm  06/05/2014   CLINICAL DATA:  68 year old female with acute onset of shortness of breath with associated Sharp left-sided chest pain radiating into the left jaw. Some associated nausea earlier this morning.  EXAM: CT ANGIOGRAPHY CHEST WITH CONTRAST  TECHNIQUE: Multidetector CT imaging of the chest was performed using the standard protocol  during bolus administration of intravenous contrast. Multiplanar CT image reconstructions and MIPs were obtained to evaluate the vascular anatomy.  CONTRAST:  100mL OMNIPAQUE IOHEXOL 350 MG/ML SOLN  COMPARISON:  No priors.  FINDINGS: Mediastinum: Large volume of clot in the distal main pulmonary arteries bilaterally extending into lobar, segmental and subsegmental sized pulmonary artery branches bilaterally. No central saddle embolus is noted. Pulmonic trunk is mildly dilated measuring up to 3.1 cm. Right ventricle appears mildly dilated right ventricle is slightly prominent measuring up to 4.4 cm in diameter. Left ventricular diameter is up to 4.3 cm on today's non gated CT examination. RV to LV ratio of 1.02. No pericardial fluid, thickening or pericardial calcification. Numerous enlarged and borderline enlarged mediastinal and bilateral hilar lymph  nodes. Specific examples include AP window lymph node measuring 1 cm, low right paratracheal lymph node measuring 13 mm, left hilar lymph node measuring 11 mm, and a subcarinal lymph node measuring 11 mm. Small hiatal hernia.  Lungs/Pleura: In the perihilar aspect of the right lung intimately associated with the major fissure (image 48 of series 4) there is a smoothly marginated 12 x 21 mm nodular lesion, which the other represent a nodule in the right middle or lower lobe, or could simply represent an enlarged interlobar lymph node. No other suspicious appearing pulmonary nodules or masses are noted. No acute consolidative airspace disease. No pleural effusions.  Upper Abdomen: Unremarkable.  Musculoskeletal: There are no aggressive appearing lytic or blastic lesions noted in the visualized portions of the skeleton.  Review of the MIP images confirms the above findings.  IMPRESSION: 1. Positive for acute PE with CT evidence of right heartstrain (RV/LV Ratio = 1.02) consistent with at least submassive (intermediate risk) PE. The presence of right heart strain has been associated with anincreased risk of morbidity and mortality. Consultation with Pulmonary and Critical Care Medicine is recommended. 2. The patient has extensive mediastinal and bilateral hilar lymphadenopathy. In addition, there is a nodular opacity in the perihilar aspect of the right lung measuring 12 x 21 mm. It is uncertain whether or not this represents a pulmonary nodule, or is simply an enlarged interlobar lymph node (which is favored). The overall appearance is favored to reflect a lymphoproliferative disorder, and clinical correlation is recommended. Follow-up imaging is suggested to ensure resolution of these findings after appropriate medical therapy. Critical Value/emergent results were called by telephone at the time of interpretation on 06/05/2014 at 7:30 pm to Dr. Pricilla LovelessSCOTT GOLDSTON, who verbally acknowledged these results.   Electronically  Signed   By: Trudie Reedaniel  Entrikin M.D.   On: 06/05/2014 19:30   Dg Chest Port 1 View  06/05/2014   CLINICAL DATA:  Shortness of breath and chest pain since this a.m. History of rapid heart rate.  EXAM: PORTABLE CHEST - 1 VIEW  COMPARISON:  08/04/2011  FINDINGS: Prominent right hilar vascular structures but similar to the previous examination. There is no evidence for frank pulmonary edema. Negative for airspace disease. Heart and mediastinum are within normal limits.  IMPRESSION: Prominent central vascular structures, particularly on the right side. Findings could represent vascular congestion. There is not frank pulmonary edema.   Electronically Signed   By: Richarda OverlieAdam  Henn M.D.   On: 06/05/2014 12:25    Scheduled Meds: . folic acid  1 mg Oral Daily  . lisinopril  20 mg Oral Daily   And  . hydrochlorothiazide  12.5 mg Oral Daily  . pantoprazole  40 mg Oral Daily  . senna  1 tablet  Oral BID  . sodium chloride  3 mL Intravenous Q12H  . thiamine  100 mg Oral Daily   Continuous Infusions: . sodium chloride 50 mL/hr at 06/05/14 2151  . heparin 1,450 Units/hr (06/06/14 1233)    Active Problems:   G E R D   Pulmonary embolism   Hypertension    Time spent: 30 minutes    Vassie Loll  [facility] Pager [phone]. If 7PM-7AM, please contact night-coverage at [email or web address], password [facility] 06/06/2014, 1:53 PM  LOS: 1 day

## 2014-06-06 NOTE — Progress Notes (Signed)
Pt's son request pt to have foley d/t concerns about pt throwing another clot. md on call paged awaiting call back.

## 2014-06-06 NOTE — Progress Notes (Signed)
ANTICOAGULATION CONSULT NOTE - Initial Consult  Pharmacy Consult for:  IV Heparin Indication:  Pulmonary embolism  Allergies  Allergen Reactions  . Nutmeg Oil (Myristica Oil) Itching  . Hydrocodone Palpitations  . Penicillins Rash  . Prednisone Palpitations    Patient Measurements: 06/05/14 -- Height 67 inches  Weight 97.5 kg Heparin Dosing Weight:  83.2 kg  Vital Signs: Temp: 97.5 F (36.4 C) (10/24 0000) Temp Source: Oral (10/24 0000) BP: 122/62 mmHg (10/24 0300) Pulse Rate: 76 (10/24 0300)  Labs:  Recent Labs  06/05/14 1237 06/05/14 1936 06/06/14 0253  HGB 13.3  --  12.3  HCT 38.6  --  33.7*  PLT 236  --  227  APTT  --  29  --   LABPROT  --  14.0  --   INR  --  1.07  --   HEPARINUNFRC  --   --  0.37  CREATININE 0.60  --  0.59   The estimated CrCl is 80.8 ml/min using SCr 0.6 and the Cockroft-Gault equation.  Medical History: Past Medical History  Diagnosis Date  . GERD (gastroesophageal reflux disease)   . Hypertension   . Arthritis     Medications:  Pending  Assessment: Asked to assist with IV Heparin therapy for this 68 year-old female with an acute PE, with CT evidence of right heartstrain consistent with at least a submassive PE.  1st HL=0.37 units/ml- @ goal  No bleeding or infusion problems per RN   Goals of Therapy:   Heparin level 0.3-0.7 units/ml  Monitor platelets by anticoagulation protocol: Yes   Plan:   Increase heparin drip to 1450 units/ml   Recheck HL in 6 hrs  Susanne GreenhouseGreen, Makinley Muscato R 06/06/2014,4:39 AM

## 2014-06-06 NOTE — Progress Notes (Signed)
ANTICOAGULATION CONSULT NOTE - Follow Up Consult  Pharmacy Consult for Heparin Indication: Pulmonary embolus  Allergies  Allergen Reactions  . Nutmeg Oil (Myristica Oil) Itching  . Hydrocodone Palpitations  . Penicillins Rash  . Prednisone Palpitations    Patient Measurements: Height: 5\' 7"  (170.2 cm) Weight: 215 lb (97.523 kg) IBW/kg (Calculated) : 61.6   Vital Signs: Temp: 98.6 F (37 C) (10/24 0800) Temp Source: Oral (10/24 0800) BP: 141/69 mmHg (10/24 1200) Pulse Rate: 81 (10/24 1200)  Labs:  Recent Labs  06/05/14 1237 06/05/14 1936 06/06/14 0253 06/06/14 1043  HGB 13.3  --  12.3  --   HCT 38.6  --  33.7*  --   PLT 236  --  227  --   APTT  --  29  --   --   LABPROT  --  14.0  --   --   INR  --  1.07  --   --   HEPARINUNFRC  --   --  0.37 0.61  CREATININE 0.60  --  0.59  --     Estimated Creatinine Clearance: 80.8 ml/min (by C-G formula based on Cr of 0.59).   Medications:  Scheduled:  . folic acid  1 mg Oral Daily  . lisinopril  20 mg Oral Daily   And  . hydrochlorothiazide  12.5 mg Oral Daily  . pantoprazole  40 mg Oral Daily  . senna  1 tablet Oral BID  . sodium chloride  3 mL Intravenous Q12H  . thiamine  100 mg Oral Daily   Infusions:  . sodium chloride 50 mL/hr at 06/05/14 2151  . heparin 1,450 Units/hr (06/06/14 0439)   PRN: acetaminophen, acetaminophen, bisacodyl, morphine injection, ondansetron (ZOFRAN) IV, ondansetron, oxyCODONE, zolpidem  Assessment: 68 y/o F with acute CT-proven large acute pulmonary embolism and R heart strain consistent with at least submassive clot burden.  Orders were received to begin IV heparin with pharmacy dosing assistance.   Goal of Therapy:  Heparin level 0.3 - 0.7 Monitor platelets by anticoagulation protocol: Yes  Today, 10/24: Heparin level therapeutic (0.61) on heparin 1450 units/hr. Hgb and pltc WNL. No bleeding reported in chart notes.  Plan:  1. Continue heparin at present rate (1450  units/hr) 2. Heparin level and CBC daily. 3. Follow clinical course.  Elie Goodyandy Lizzete Gough, PharmD, BCPS Pager: [phone] 06/06/2014  12:24 PM

## 2014-06-07 DIAGNOSIS — I5032 Chronic diastolic (congestive) heart failure: Secondary | ICD-10-CM

## 2014-06-07 DIAGNOSIS — R079 Chest pain, unspecified: Secondary | ICD-10-CM

## 2014-06-07 DIAGNOSIS — I517 Cardiomegaly: Secondary | ICD-10-CM

## 2014-06-07 DIAGNOSIS — I2699 Other pulmonary embolism without acute cor pulmonale: Secondary | ICD-10-CM

## 2014-06-07 LAB — CBC
HCT: 34.6 % — ABNORMAL LOW (ref 36.0–46.0)
HEMOGLOBIN: 11.7 g/dL — AB (ref 12.0–15.0)
MCH: 30.5 pg (ref 26.0–34.0)
MCHC: 33.8 g/dL (ref 30.0–36.0)
MCV: 90.1 fL (ref 78.0–100.0)
Platelets: 237 10*3/uL (ref 150–400)
RBC: 3.84 MIL/uL — ABNORMAL LOW (ref 3.87–5.11)
RDW: 12.5 % (ref 11.5–15.5)
WBC: 5.4 10*3/uL (ref 4.0–10.5)

## 2014-06-07 LAB — HEPARIN LEVEL (UNFRACTIONATED)
HEPARIN UNFRACTIONATED: 0.79 [IU]/mL — AB (ref 0.30–0.70)
HEPARIN UNFRACTIONATED: 0.6 [IU]/mL (ref 0.30–0.70)

## 2014-06-07 LAB — GLUCOSE, CAPILLARY: Glucose-Capillary: 146 mg/dL — ABNORMAL HIGH (ref 70–99)

## 2014-06-07 MED ORDER — LORAZEPAM 2 MG/ML IJ SOLN
0.5000 mg | Freq: Once | INTRAMUSCULAR | Status: AC
  Administered 2014-06-07: 0.5 mg via INTRAVENOUS
  Filled 2014-06-07: qty 1

## 2014-06-07 MED ORDER — HEPARIN (PORCINE) IN NACL 100-0.45 UNIT/ML-% IJ SOLN
1200.0000 [IU]/h | INTRAMUSCULAR | Status: AC
  Administered 2014-06-08: 1200 [IU]/h via INTRAVENOUS
  Filled 2014-06-07 (×2): qty 250

## 2014-06-07 MED ORDER — HEPARIN (PORCINE) IN NACL 100-0.45 UNIT/ML-% IJ SOLN
1200.0000 [IU]/h | INTRAMUSCULAR | Status: DC
  Administered 2014-06-07: 1200 [IU]/h via INTRAVENOUS
  Filled 2014-06-07: qty 250

## 2014-06-07 MED ORDER — RIVAROXABAN 15 MG PO TABS
15.0000 mg | ORAL_TABLET | Freq: Two times a day (BID) | ORAL | Status: DC
  Filled 2014-06-07 (×2): qty 1

## 2014-06-07 NOTE — Progress Notes (Signed)
VASCULAR LAB PRELIMINARY  PRELIMINARY  PRELIMINARY  PRELIMINARY  Bilateral lower extremity venous duplex completed.    Preliminary report:  Bilateral:  No evidence of DVT, superficial thrombosis, or Baker's Cyst.   Elza Sortor, RVS 06/07/2014, 8:58 AM

## 2014-06-07 NOTE — Progress Notes (Addendum)
ANTICOAGULATION CONSULT NOTE - Follow Up Consult  Pharmacy Consult for Heparin >> Xarelto Indication: Pulmonary embolus  See addendum below  Assessment: 68 y/o F with acute CT-proven large acute pulmonary embolism and R heart strain consistent with at least submassive clot burden.  Pharmacy initially consulted to dose heparin; now consulted to switch to Xarelto prior to discharge  Today, 10/25: Heparin level currently therapeutic Hgb slightly low, but CBC otherwise wnl. Renal function intact; SCr 0.6; CrCl > 80 using TBW for Xarelto and SCr = 1 Takes omega-3 fish oil PTA, but no significant drug-drug interactions noted  Goal of Therapy:  Heparin level 0.3 - 0.7 Monitor platelets by anticoagulation protocol while on heparin: Yes Transition patient to Xarelto PE treatment dosing Prevention of recurrent VTE  Plan:  1. Continue Heparin at 1200 units/hr for now 2. Turn off heparin at 1700 today 3. Begin rivaroxaban 15 mg bid starting at 1700 today 4. CBC at least q72hr while on rivaroxaban 5. Follow clinical course, renal function 6. Xarelto education prior to discharge  ----------------------------------------------------------------------------------------------------------------------- Addendum: Patient reports she cannot get coverage for Xarelto.  Will await medical/care mgmt decision on Monday AM.   Heparin to continue Xarelto order d/c'd Resume daily heparin level/CBC      Allergies  Allergen Reactions  . Nutmeg Oil (Myristica Oil) Itching  . Hydrocodone Palpitations  . Penicillins Rash  . Prednisone Palpitations    Patient Measurements: Height: 5\' 7"  (170.2 cm) Weight: 215 lb (97.523 kg) IBW/kg (Calculated) : 61.6   Vital Signs: Temp: 97.9 F (36.6 C) (10/25 0448) Temp Source: Oral (10/25 0448) BP: 128/66 mmHg (10/25 0448) Pulse Rate: 79 (10/25 0448)  Labs:  Recent Labs  06/05/14 1237 06/05/14 1936  06/06/14 0253 06/06/14 1043 06/07/14 0530  06/07/14 0536 06/07/14 1300  HGB 13.3  --   --  12.3  --   --  11.7*  --   HCT 38.6  --   --  33.7*  --   --  34.6*  --   PLT 236  --   --  227  --   --  237  --   APTT  --  29  --   --   --   --   --   --   LABPROT  --  14.0  --   --   --   --   --   --   INR  --  1.07  --   --   --   --   --   --   HEPARINUNFRC  --   --   < > 0.37 0.61 0.79*  --  0.60  CREATININE 0.60  --   --  0.59  --   --   --   --   < > = values in this interval not displayed.  Estimated Creatinine Clearance: 80.8 ml/min (by C-G formula based on Cr of 0.59).   Medications:  Scheduled:  . folic acid  1 mg Oral Daily  . lisinopril  20 mg Oral Daily   And  . hydrochlorothiazide  12.5 mg Oral Daily  . pantoprazole  40 mg Oral Daily  . senna  1 tablet Oral BID  . sodium chloride  3 mL Intravenous Q12H  . thiamine  100 mg Oral Daily   Infusions:  . sodium chloride 50 mL/hr at 06/06/14 1900  . heparin 1,200 Units/hr (06/07/14 0743)   PRN: acetaminophen, acetaminophen, bisacodyl, morphine injection, naphazoline, ondansetron (ZOFRAN) IV, ondansetron,  oxyCODONE, zolpidem   Bernadene Personrew Rami Budhu, PharmD Pager: [phone] 06/07/2014, 2:05 PM

## 2014-06-07 NOTE — Progress Notes (Signed)
Nutrition Brief Note  Patient identified on the Malnutrition Screening Tool (MST) Report  Wt Readings from Last 15 Encounters:  06/05/14 215 lb (97.523 kg)  03/11/14 226 lb (102.513 kg)  05/26/13 227 lb (102.967 kg)  03/17/12 223 lb (101.152 kg)    Body mass index is 33.67 kg/(m^2). Patient meets criteria for obesity based on current BMI.   Pt saw outpatient RD for weight loss in July. Pt reports weight loss has been intentional but has experienced some decrease in her appetite.  Emphasized the importance of eating adequate amounts of protein and suggested high protein snacks when pt does not feel like eating. Pt is active and encouraged pt to continue physical activity to maintain muscle mass. Pt refused nutritional supplements at this time.  Current diet order is heart healthy, patient is consuming approximately 100% of meals at this time. Labs and medications reviewed.   No nutrition interventions warranted at this time. If nutrition issues arise, please consult RD.   Melanie FrancoLindsey Diany Formosa, MS, RD, LDN Pager: [phone] After Hours Pager: [phone]

## 2014-06-07 NOTE — Progress Notes (Signed)
Pt reports concerns about affording xalerato.  MD informed regarding concerns.  CM consult ordered.  Will d/c xalerato at this time and continue heparin until benefits can be finalized per MD verbal orders. Dhiya Smits A

## 2014-06-07 NOTE — Progress Notes (Addendum)
[facility] PROGRESS NOTE  Melanie Pittman AVW:[id number] DOB: Aug 06, 1946 DOA: 06/05/2014 PCP: Beverley Fiedler, MD  Assessment/Plan: 1-SOB and CP: due to PE -continue heparin drip and switch to xarelto as per pharmacy rec's -will increase activity and check O2 sat -2-D echo w/o right side strain and LE duplex has r/o any DVT, SVT or baker's cyst -will increase activity   2-HTN: stable and well controlled -will continue current antihypertensive regimen  3-GERD: continue PPI  4-constipation: continue senna  5-Chronic diastolic heart failure: most likely due to HTN. Preserved EF. Grade 1 -continue HTN control -patient euvolemic at this point -follow heart healthy diet  Code Status: Full Family Communication: no family at bedside Disposition Plan: will d/c telemetry and will switch to xarelto as per pharmacy; home in am if remains stable.   Consultants:  None   Procedures:  CT angio: with positive PE  2-D echo:  - Left ventricle: The cavity size was normal. Wall thickness was increased in a pattern of moderate LVH. Systolic function was normal. The estimated ejection fraction was in the range of 60% to 65%. Wall motion was normal; there were no regional wall motion abnormalities. Doppler parameters are consistent with abnormal left ventricular relaxation (grade 1 diastolic dysfunction). - Aortic valve: Valve area (VTI): 1.98 cm^2. Valve area (Vmax): 1.91 cm^2. -Right ventricle: The cavity size was normal. Systolic function was normal. RV TAPSE is 2.4 cm.   LE duplex: negative DVT, SVT or baker's cyst  Antibiotics:  None   HPI/Subjective: Feeling better, complaining of pain on her left calf and mild soreness in her chest. VVS and denies SOB  Objective: Filed Vitals:   06/07/14 0448  BP: 128/66  Pulse: 79  Temp: 97.9 F (36.6 C)  Resp: 20    Intake/Output Summary (Last 24 hours) at 06/07/14 1343 Last data filed at 06/07/14 1300  Gross per 24  hour  Intake   1855 ml  Output    800 ml  Net   1055 ml   Filed Weights   06/05/14 1954  Weight: 97.523 kg (215 lb)    Exam:   General:  Feeling better, mild chest soreness and pain on her left calf. No fever and denies SOB  Cardiovascular: RRR, no rubs or gallops  Respiratory: CTA bilaterally  Abdomen: soft, NT, ND, positive BS  Musculoskeletal: no rubs or gallops  Data Reviewed: Basic Metabolic Panel:  Recent Labs Lab 06/05/14 1237 06/06/14 0253  NA 138 135*  K 3.6* 3.6*  CL 101 99  CO2 23 24  GLUCOSE 100* 120*  BUN 12 11  CREATININE 0.60 0.59  CALCIUM 10.2 9.4   Liver Function Tests:  Recent Labs Lab 06/05/14 1620 06/06/14 0253  AST 30 17  ALT 19 15  ALKPHOS 105 100  BILITOT 0.4 0.5  PROT 7.9 6.9  ALBUMIN 3.8 3.4*    Recent Labs Lab 06/05/14 1620  LIPASE 29   CBC:  Recent Labs Lab 06/05/14 1237 06/06/14 0253 06/07/14 0536  WBC 7.6 6.2 5.4  HGB 13.3 12.3 11.7*  HCT 38.6 33.7* 34.6*  MCV 90.8 89.2 90.1  PLT 236 227 237   BNP (last 3 results)  Recent Labs  06/05/14 1237  PROBNP 65.6   CBG:  Recent Labs Lab 06/06/14 0810 06/07/14 0816  GLUCAP 107* 146*    Recent Results (from the past 240 hour(s))  MRSA PCR SCREENING     Status: None   Collection Time    06/05/14  9:37 PM  Result Value Ref Range Status   MRSA by PCR NEGATIVE  NEGATIVE Final   Comment:            The GeneXpert MRSA Assay (FDA     approved for NASAL specimens     only), is one component of a     comprehensive MRSA colonization     surveillance program. It is not     intended to diagnose MRSA     infection nor to guide or     monitor treatment for     MRSA infections.     Studies: Ct Angio Chest Pe W/cm &/or Wo Cm  06/05/2014   CLINICAL DATA:  68 year old female with acute onset of shortness of breath with associated Sharp left-sided chest pain radiating into the left jaw. Some associated nausea earlier this morning.  EXAM: CT ANGIOGRAPHY  CHEST WITH CONTRAST  TECHNIQUE: Multidetector CT imaging of the chest was performed using the standard protocol during bolus administration of intravenous contrast. Multiplanar CT image reconstructions and MIPs were obtained to evaluate the vascular anatomy.  CONTRAST:  100mL OMNIPAQUE IOHEXOL 350 MG/ML SOLN  COMPARISON:  No priors.  FINDINGS: Mediastinum: Large volume of clot in the distal main pulmonary arteries bilaterally extending into lobar, segmental and subsegmental sized pulmonary artery branches bilaterally. No central saddle embolus is noted. Pulmonic trunk is mildly dilated measuring up to 3.1 cm. Right ventricle appears mildly dilated right ventricle is slightly prominent measuring up to 4.4 cm in diameter. Left ventricular diameter is up to 4.3 cm on today's non gated CT examination. RV to LV ratio of 1.02. No pericardial fluid, thickening or pericardial calcification. Numerous enlarged and borderline enlarged mediastinal and bilateral hilar lymph nodes. Specific examples include AP window lymph node measuring 1 cm, low right paratracheal lymph node measuring 13 mm, left hilar lymph node measuring 11 mm, and a subcarinal lymph node measuring 11 mm. Small hiatal hernia.  Lungs/Pleura: In the perihilar aspect of the right lung intimately associated with the major fissure (image 48 of series 4) there is a smoothly marginated 12 x 21 mm nodular lesion, which the other represent a nodule in the right middle or lower lobe, or could simply represent an enlarged interlobar lymph node. No other suspicious appearing pulmonary nodules or masses are noted. No acute consolidative airspace disease. No pleural effusions.  Upper Abdomen: Unremarkable.  Musculoskeletal: There are no aggressive appearing lytic or blastic lesions noted in the visualized portions of the skeleton.  Review of the MIP images confirms the above findings.  IMPRESSION: 1. Positive for acute PE with CT evidence of right heartstrain (RV/LV Ratio  = 1.02) consistent with at least submassive (intermediate risk) PE. The presence of right heart strain has been associated with anincreased risk of morbidity and mortality. Consultation with Pulmonary and Critical Care Medicine is recommended. 2. The patient has extensive mediastinal and bilateral hilar lymphadenopathy. In addition, there is a nodular opacity in the perihilar aspect of the right lung measuring 12 x 21 mm. It is uncertain whether or not this represents a pulmonary nodule, or is simply an enlarged interlobar lymph node (which is favored). The overall appearance is favored to reflect a lymphoproliferative disorder, and clinical correlation is recommended. Follow-up imaging is suggested to ensure resolution of these findings after appropriate medical therapy. Critical Value/emergent results were called by telephone at the time of interpretation on 06/05/2014 at 7:30 pm to Dr. Pricilla LovelessSCOTT GOLDSTON, who verbally acknowledged these results.   Electronically Signed   By:  Trudie Reedaniel  Entrikin M.D.   On: 06/05/2014 19:30    Scheduled Meds: . folic acid  1 mg Oral Daily  . lisinopril  20 mg Oral Daily   And  . hydrochlorothiazide  12.5 mg Oral Daily  . pantoprazole  40 mg Oral Daily  . senna  1 tablet Oral BID  . sodium chloride  3 mL Intravenous Q12H  . thiamine  100 mg Oral Daily   Continuous Infusions: . sodium chloride 50 mL/hr at 06/06/14 1900  . heparin 1,200 Units/hr (06/07/14 0743)    Active Problems:   G E R D   Pulmonary embolism   Hypertension    Time spent: 30 minutes    Vassie LollMadera, Koy Lamp  [facility] Pager [phone]. If 7PM-7AM, please contact night-coverage at [email or web address], password [email or web address] 06/07/2014, 1:43 PM  LOS: 2 days

## 2014-06-07 NOTE — Progress Notes (Signed)
ANTICOAGULATION CONSULT NOTE - Follow Up Consult  Pharmacy Consult for Heparin Indication: Pulmonary embolus  Allergies  Allergen Reactions  . Nutmeg Oil (Myristica Oil) Itching  . Hydrocodone Palpitations  . Penicillins Rash  . Prednisone Palpitations    Patient Measurements: Height: 5\' 7"  (170.2 cm) Weight: 215 lb (97.523 kg) IBW/kg (Calculated) : 61.6   Vital Signs: Temp: 97.9 F (36.6 C) (10/25 0448) Temp Source: Oral (10/25 0448) BP: 128/66 mmHg (10/25 0448) Pulse Rate: 79 (10/25 0448)  Labs:  Recent Labs  06/05/14 1237 06/05/14 1936  06/06/14 0253 06/06/14 1043 06/07/14 0530 06/07/14 0536 06/07/14 1300  HGB 13.3  --   --  12.3  --   --  11.7*  --   HCT 38.6  --   --  33.7*  --   --  34.6*  --   PLT 236  --   --  227  --   --  237  --   APTT  --  29  --   --   --   --   --   --   LABPROT  --  14.0  --   --   --   --   --   --   INR  --  1.07  --   --   --   --   --   --   HEPARINUNFRC  --   --   < > 0.37 0.61 0.79*  --  0.60  CREATININE 0.60  --   --  0.59  --   --   --   --   < > = values in this interval not displayed.  Estimated Creatinine Clearance: 80.8 ml/min (by C-G formula based on Cr of 0.59).   Medications:  Scheduled:  . folic acid  1 mg Oral Daily  . lisinopril  20 mg Oral Daily   And  . hydrochlorothiazide  12.5 mg Oral Daily  . pantoprazole  40 mg Oral Daily  . senna  1 tablet Oral BID  . sodium chloride  3 mL Intravenous Q12H  . thiamine  100 mg Oral Daily   Infusions:  . sodium chloride 50 mL/hr at 06/06/14 1900  . heparin 1,200 Units/hr (06/07/14 0743)   PRN: acetaminophen, acetaminophen, bisacodyl, morphine injection, naphazoline, ondansetron (ZOFRAN) IV, ondansetron, oxyCODONE, zolpidem  Assessment: 68 y/o F with acute CT-proven large acute pulmonary embolism and R heart strain consistent with at least submassive clot burden.  Orders were received to begin IV heparin with pharmacy dosing assistance.   Goal of Therapy:   Heparin level 0.3 - 0.7 Monitor platelets by anticoagulation protocol: Yes  Today, 10/25: Heparin level now therapeutic after reduction in rate this AM to 1200 units/hr.   Plan:  1. Continue Heparin at 1200 units/hr. 2. Heparin level and CBC daily.   3. Follow clinical course.  Elie Goodyandy Tnia Anglada, PharmD, BCPS Pager: [phone] 06/07/2014  1:37 PM

## 2014-06-07 NOTE — Progress Notes (Signed)
ANTICOAGULATION CONSULT NOTE - Follow Up Consult  Pharmacy Consult for Heparin Indication: Pulmonary embolus  Allergies  Allergen Reactions  . Nutmeg Oil (Myristica Oil) Itching  . Hydrocodone Palpitations  . Penicillins Rash  . Prednisone Palpitations    Patient Measurements: Height: 5\' 7"  (170.2 cm) Weight: 215 lb (97.523 kg) IBW/kg (Calculated) : 61.6   Vital Signs: Temp: 97.9 F (36.6 C) (10/25 0448) Temp Source: Oral (10/25 0448) BP: 128/66 mmHg (10/25 0448) Pulse Rate: 79 (10/25 0448)  Labs:  Recent Labs  06/05/14 1237 06/05/14 1936 06/06/14 0253 06/06/14 1043 06/07/14 0530 06/07/14 0536  HGB 13.3  --  12.3  --   --  11.7*  HCT 38.6  --  33.7*  --   --  34.6*  PLT 236  --  227  --   --  237  APTT  --  29  --   --   --   --   LABPROT  --  14.0  --   --   --   --   INR  --  1.07  --   --   --   --   HEPARINUNFRC  --   --  0.37 0.61 0.79*  --   CREATININE 0.60  --  0.59  --   --   --     Estimated Creatinine Clearance: 80.8 ml/min (by C-G formula based on Cr of 0.59).   Medications:  Scheduled:  . folic acid  1 mg Oral Daily  . lisinopril  20 mg Oral Daily   And  . hydrochlorothiazide  12.5 mg Oral Daily  . pantoprazole  40 mg Oral Daily  . senna  1 tablet Oral BID  . sodium chloride  3 mL Intravenous Q12H  . thiamine  100 mg Oral Daily   Infusions:  . sodium chloride 50 mL/hr at 06/06/14 1900  . heparin     PRN: acetaminophen, acetaminophen, bisacodyl, morphine injection, naphazoline, ondansetron (ZOFRAN) IV, ondansetron, oxyCODONE, zolpidem  Assessment: 68 y/o F with acute CT-proven large acute pulmonary embolism and R heart strain consistent with at least submassive clot burden.  Orders were received to begin IV heparin with pharmacy dosing assistance.   Goal of Therapy:  Heparin level 0.3 - 0.7 Monitor platelets by anticoagulation protocol: Yes  Today, 10/25: Heparin level supratherapeutic (0.79) on heparin 1450 units/hr. RN confirms  infusion rate and indicates no evidence of bleeding.  Hgb drifting down but > 11.  Pltc WNL.   Plan:  1. Reduce heparin infusion to 1200 units/hr 2. Recheck heparin level in 6 hours. 3. Follow clinical course. 4. Heparin level and CBC daily.  Melanie Pittman, PharmD, BCPS Pager: [phone] 06/07/2014  6:58 AM

## 2014-06-07 NOTE — Progress Notes (Signed)
  Echocardiogram 2D Echocardiogram has been performed.  Melanie Pittman, Melanie Pittman 06/07/2014, 11:43 AM

## 2014-06-07 NOTE — Progress Notes (Signed)
06/07/2014 1515 Benefit check sent for copay price for Eliquis or Xarelto. NCM will follow up with attending MD with price of medications. Isidoro DonningAlesia Dois Juarbe RN CCM Case Mgmt phone [phone]

## 2014-06-08 LAB — BASIC METABOLIC PANEL
Anion gap: 12 (ref 5–15)
BUN: 10 mg/dL (ref 6–23)
CALCIUM: 9.3 mg/dL (ref 8.4–10.5)
CO2: 24 mEq/L (ref 19–32)
CREATININE: 0.61 mg/dL (ref 0.50–1.10)
Chloride: 103 mEq/L (ref 96–112)
GFR calc non Af Amer: >90 mL/min (ref >90)
Glucose, Bld: 105 mg/dL — ABNORMAL HIGH (ref 70–99)
Potassium: 4.1 mEq/L (ref 3.7–5.3)
Sodium: 139 mEq/L (ref 137–147)

## 2014-06-08 LAB — CBC
HCT: 34.2 % — ABNORMAL LOW (ref 36.0–46.0)
Hemoglobin: 11.6 g/dL — ABNORMAL LOW (ref 12.0–15.0)
MCH: 30.3 pg (ref 26.0–34.0)
MCHC: 33.9 g/dL (ref 30.0–36.0)
MCV: 89.3 fL (ref 78.0–100.0)
PLATELETS: 252 10*3/uL (ref 150–400)
RBC: 3.83 MIL/uL — AB (ref 3.87–5.11)
RDW: 12.4 % (ref 11.5–15.5)
WBC: 6.3 10*3/uL (ref 4.0–10.5)

## 2014-06-08 LAB — GLUCOSE, CAPILLARY: Glucose-Capillary: 105 mg/dL — ABNORMAL HIGH (ref 70–99)

## 2014-06-08 LAB — HEPARIN LEVEL (UNFRACTIONATED): Heparin Unfractionated: 0.48 IU/mL (ref 0.30–0.70)

## 2014-06-08 MED ORDER — APIXABAN 5 MG PO TABS
10.0000 mg | ORAL_TABLET | Freq: Two times a day (BID) | ORAL | Status: DC
  Filled 2014-06-08: qty 2

## 2014-06-08 MED ORDER — PANTOPRAZOLE SODIUM 40 MG PO TBEC: 40.0000 mg | DELAYED_RELEASE_TABLET | Freq: Every day | ORAL | Status: DC

## 2014-06-08 MED ORDER — APIXABAN 5 MG PO TABS: 10.0000 mg | ORAL_TABLET | Freq: Two times a day (BID) | ORAL | Status: DC

## 2014-06-08 MED ORDER — APIXABAN 5 MG PO TABS: 5.0000 mg | ORAL_TABLET | Freq: Two times a day (BID) | ORAL | Status: DC

## 2014-06-08 MED ORDER — GI COCKTAIL ~~LOC~~
30.0000 mL | Freq: Once | ORAL | Status: AC
  Administered 2014-06-08: 30 mL via ORAL
  Filled 2014-06-08: qty 30

## 2014-06-08 MED ORDER — APIXABAN 5 MG PO TABS
10.0000 mg | ORAL_TABLET | ORAL | Status: AC
  Administered 2014-06-08: 10 mg via ORAL
  Filled 2014-06-08: qty 2

## 2014-06-08 NOTE — Progress Notes (Addendum)
ANTICOAGULATION CONSULT NOTE - Follow Up Consult  Pharmacy Consult for Heparin --> apixaban Indication: Pulmonary embolus  Allergies  Allergen Reactions  . Nutmeg Oil (Myristica Oil) Itching  . Hydrocodone Palpitations  . Penicillins Rash  . Prednisone Palpitations    Patient Measurements: Height: 5\' 7"  (170.2 cm) Weight: 215 lb (97.523 kg) IBW/kg (Calculated) : 61.6   Vital Signs: Temp: 97.8 F (36.6 C) (10/26 0521) Temp Source: Oral (10/26 0521) BP: 126/60 mmHg (10/26 0521) Pulse Rate: 70 (10/26 0521)  Labs:  Recent Labs  06/05/14 1237 06/05/14 1936 06/06/14 0253  06/07/14 0530 06/07/14 0536 06/07/14 1300 06/08/14 0430 06/08/14 0438  HGB 13.3  --  12.3  --   --  11.7*  --  11.6*  --   HCT 38.6  --  33.7*  --   --  34.6*  --  34.2*  --   PLT 236  --  227  --   --  237  --  252  --   APTT  --  29  --   --   --   --   --   --   --   LABPROT  --  14.0  --   --   --   --   --   --   --   INR  --  1.07  --   --   --   --   --   --   --   HEPARINUNFRC  --   --  0.37  < > 0.79*  --  0.60  --  0.48  CREATININE 0.60  --  0.59  --   --   --   --  0.61  --   < > = values in this interval not displayed.  Estimated Creatinine Clearance: 80.8 ml/min (by C-G formula based on Cr of 0.61).   Medications:  Infusions:  . sodium chloride 10 mL/hr at 06/07/14 1456  . heparin 1,200 Units/hr (06/08/14 0622)   PRN: acetaminophen, acetaminophen, bisacodyl, morphine injection, naphazoline, ondansetron (ZOFRAN) IV, ondansetron, oxyCODONE, zolpidem  Assessment: 68 y/o F with acute CT-proven large acute pulmonary embolism and R heart strain consistent with at least submassive clot burden.  Orders were received to begin IV heparin with pharmacy dosing assistance. 10/25 orders to convert to rivaroxaban but question of affordability, so order stopped until CM can help assess cost.    Today, 10/26:  Heparin level =  0.48 therapeutic on heparin 1200 units/hr.  CBC: Hgb low/stable,  pltc WNL  Goal of Therapy:  Heparin level 0.3 - 0.7 Monitor platelets by anticoagulation protocol: Yes  Plan:  1. Continue Heparin at 1200 units/hr. 2. Heparin level and CBC daily.   3. Follow clinical course and await results of CM benefit check initiated 10/25  Juliette Alcideustin Clothilde Tippetts, PharmD, BCPS.   Pager: [phone] 06/08/2014  8:11 AM  Addendum:   Orders to change from heparin gtt to apixaban, appears cost will be $45/month.  Will give patient card so that 1st month will be free.  Plan:  Eliquis 10mg  BID x 7d days then 5mg  PO BID (start 5mg  dose 06/15/14)  Give 1st dose now  D/C heparin gtt at time of 1st dose apixaban given  D/C heparin labs  Provide education  Note that if patient discharged will need additional 5mg  tabs to make 10mg  BID dosing, thus if discharged tonight will need Rx for #72 tabs for one month supply  Juliette Alcideustin Jennie Bolar, PharmD, BCPS.   Pager:  [phone] 06/08/2014 1:30 PM

## 2014-06-08 NOTE — Discharge Instructions (Signed)
Information on my medicine - ELIQUIS (apixaban)  This medication education was reviewed with me or my healthcare representative as part of my discharge preparation.  The pharmacist that spoke with me during my hospital stay was:  Dannielle HuhZeigler, Colson Barco George, [facility]  Why was Eliquis prescribed for you? Eliquis was prescribed to treat blood clots that may have been found in the veins of your legs (deep vein thrombosis) or in your lungs (pulmonary embolism) and to reduce the risk of them occurring again.  What do You need to know about Eliquis ? The starting dose is 10 mg (two 5 mg tablets) taken TWICE daily for the FIRST SEVEN (7) DAYS, then on Monday November 2nd the dose is reduced to ONE 5 mg tablet taken TWICE daily.  Eliquis may be taken with or without food.   Try to take the dose about the same time in the morning and in the evening. If you have difficulty swallowing the tablet whole please discuss with your pharmacist how to take the medication safely.  Take Eliquis exactly as prescribed and DO NOT stop taking Eliquis without talking to the doctor who prescribed the medication.  Stopping may increase your risk of developing a new blood clot.  Refill your prescription before you run out.  After discharge, you should have regular check-up appointments with your healthcare provider that is prescribing your Eliquis.    What do you do if you miss a dose? If a dose of ELIQUIS is not taken at the scheduled time, take it as soon as possible on the same day and twice-daily administration should be resumed. The dose should not be doubled to make up for a missed dose.  Important Safety Information A possible side effect of Eliquis is bleeding. You should call your healthcare provider right away if you experience any of the following:   Bleeding from an injury or your nose that does not stop.   Unusual colored urine (red or dark brown) or unusual colored stools (red or black).   Unusual bruising  for unknown reasons.   A serious fall or if you hit your head (even if there is no bleeding).  Some medicines may interact with Eliquis and might increase your risk of bleeding or clotting while on Eliquis. To help avoid this, consult your healthcare provider or pharmacist prior to using any new prescription or non-prescription medications, including herbals, vitamins, non-steroidal anti-inflammatory drugs (NSAIDs) and supplements.  This website has more information on Eliquis (apixaban): http://www.eliquis.com/eliquis/home

## 2014-06-08 NOTE — Discharge Summary (Signed)
Physician Discharge Summary  Rose Fillersrnis Dahlstrom ZOX:[id number] DOB: 1946-03-24 DOA: 06/05/2014  PCP: Beverley FiedlerANKINS,VICTORIA, MD  Admit date: 06/05/2014 Discharge date: 06/08/2014  Time spent: >30 minutes  Recommendations for Outpatient Follow-up:  CBC to follow Hgb and platelets counts BMET to follow electrolytes and renal function as patient is on lisinopril and diuretics  Discharge Diagnoses:  Pleuritic CP (due to PE) G E R D Pulmonary embolism Hypertension Grade 1 chronic diastolic heart failure  Discharge Condition: stable and improved. Discharge home. Will follow with PCP in 10 days  Diet recommendation: heart healthy diet  Filed Weights   06/05/14 1954  Weight: 97.523 kg (215 lb)    History of present illness:  68 y.o. female presents with shortness of breath. She states that she was going to the gym today and then suddenly developed shortness of breath with exertion and at rest. She noted some type of heart pain in her chest. She states that she has no wheeze noted no cough noted. She states on Sunday she walked a lot and noted at that time that her calf was hurting but did not think too much about it other than massaging it. She states that she has not travelled anywhere no plane ride or car ride. She has only hypertension and has no prior history of clots.   Hospital Course:  1-SOB and CP: due to PE  -patient was continue on heparin drip and after confirming ability to afford nobel agents, was switch to eliquis (10mg  BID X7 days and subsequently 5mg  BID) -anticipate treatment will be needed for 6 months -Hgb stable and no signs of bleeding or thrombocytopenia -2-D echo w/o right side strain and LE duplex has r/o any DVT, SVT or baker's cyst   2-HTN: stable and well controlled  -will continue current antihypertensive regimen  -patient advise to follow a low sodium diet  3-GERD: continue PPI   4-constipation: continue PRN stool softeners and increase intake of fiber in her  diet  5-Chronic diastolic heart failure: most likely due to HTN. Preserved EF. Grade 1  -continue HTN control  -patient euvolemic at this point  -follow low sodium/heart healthy diet   Procedures:  CT angio: with positive PE    2-D echo:  -Left ventricle: The cavity size was normal. Wall thickness was increased in a pattern of moderate LVH. Systolic function was normal. The estimated ejection fraction was in the range of 60% to 65%. Wall motion was normal; there were no regional wall motion abnormalities. Doppler parameters are consistent with abnormal left ventricular relaxation (grade 1 diastolic dysfunction). - Aortic valve: Valve area (VTI): 1.98 cm^2. Valve area (Vmax): 1.91 cm^2. -Right ventricle: The cavity size was normal. Systolic function was normal. RV TAPSE is 2.4 cm.    LE duplex: negative DVT, SVT or baker's cyst  Consultations:  None   Discharge Exam: Filed Vitals:   06/08/14 0521  BP: 126/60  Pulse: 70  Temp: 97.8 F (36.6 C)  Resp: 18   General: Feeling better, no CP; No fever and denies SOB  Cardiovascular: RRR, no rubs or gallops  Respiratory: CTA bilaterally  Abdomen: soft, NT, ND, positive BS  Musculoskeletal: no rubs or gallops   Discharge Instructions You were cared for by a hospitalist during your hospital stay. If you have any questions about your discharge medications or the care you received while you were in the hospital after you are discharged, you can call the unit and asked to speak with the hospitalist on call if the  hospitalist that took care of you is not available. Once you are discharged, your primary care physician will handle any further medical issues. Please note that NO REFILLS for any discharge medications will be authorized once you are discharged, as it is imperative that you return to your primary care physician (or establish a relationship with a primary care physician if you do not have one) for your aftercare needs so  that they can reassess your need for medications and monitor your lab values.  Discharge Instructions   Discharge instructions    Complete by:  As directed   Take medications as prescribed Maintain good hydration Follow a low sodium diet Arrange follow up with PCP in 10 days          Current Discharge Medication List    START taking these medications   Details  !! apixaban (ELIQUIS) 5 MG TABS tablet Take 2 tablets (10 mg total) by mouth 2 (two) times daily. Qty: 26 tablet, Refills: 0    !! apixaban (ELIQUIS) 5 MG TABS tablet Take 1 tablet (5 mg total) by mouth 2 (two) times daily. Qty: 60 tablet, Refills: 1    pantoprazole (PROTONIX) 40 MG tablet Take 1 tablet (40 mg total) by mouth daily. Qty: 30 tablet, Refills: 1     !! - Potential duplicate medications found. Please discuss with provider.    CONTINUE these medications which have NOT CHANGED   Details  Coenzyme Q10 (CO Q 10 PO) Take 1 tablet by mouth daily.    fish oil-omega-3 fatty acids 1000 MG capsule Take 1 g by mouth daily.     lisinopril-hydrochlorothiazide (PRINZIDE,ZESTORETIC) 20-12.5 MG per tablet Take 1 tablet by mouth daily.    Multiple Vitamin (MULTIVITAMIN WITH MINERALS) TABS Take 1 tablet by mouth daily.       Allergies  Allergen Reactions  . Nutmeg Oil (Myristica Oil) Itching  . Hydrocodone Palpitations  . Penicillins Rash  . Prednisone Palpitations   Follow-up Information   Follow up with Beverley Fiedler, MD. Schedule an appointment as soon as possible for a visit in 10 days.   Specialty:  Family Medicine   Contact information:   [address] [place] [address] [phone]       The results of significant diagnostics from this hospitalization (including imaging, microbiology, ancillary and laboratory) are listed below for reference.    Significant Diagnostic Studies: Ct Angio Chest Pe W/cm &/or Wo Cm  06/05/2014   CLINICAL DATA:  68 year old female with acute onset of  shortness of breath with associated Sharp left-sided chest pain radiating into the left jaw. Some associated nausea earlier this morning.  EXAM: CT ANGIOGRAPHY CHEST WITH CONTRAST  TECHNIQUE: Multidetector CT imaging of the chest was performed using the standard protocol during bolus administration of intravenous contrast. Multiplanar CT image reconstructions and MIPs were obtained to evaluate the vascular anatomy.  CONTRAST:  OMNIPAQUE IOHEXOL 350 MG/ML SOLN  COMPARISON:  No priors.  FINDINGS: Mediastinum: Large volume of clot in the distal main pulmonary arteries bilaterally extending into lobar, segmental and subsegmental sized pulmonary artery branches bilaterally. No central saddle embolus is noted. Pulmonic trunk is mildly dilated measuring up to 3.1 cm. Right ventricle appears mildly dilated right ventricle is slightly prominent measuring up to 4.4 cm in diameter. Left ventricular diameter is up to 4.3 cm on today's non gated CT examination. RV to LV ratio of 1.02. No pericardial fluid, thickening or pericardial calcification. Numerous enlarged and borderline enlarged mediastinal and bilateral hilar  lymph nodes. Specific examples include AP window lymph node measuring 1 cm, low right paratracheal lymph node measuring 13 mm, left hilar lymph node measuring 11 mm, and a subcarinal lymph node measuring 11 mm. Small hiatal hernia.  Lungs/Pleura: In the perihilar aspect of the right lung intimately associated with the major fissure (image 48 of series 4) there is a smoothly marginated 12 x 21 mm nodular lesion, which the other represent a nodule in the right middle or lower lobe, or could simply represent an enlarged interlobar lymph node. No other suspicious appearing pulmonary nodules or masses are noted. No acute consolidative airspace disease. No pleural effusions.  Upper Abdomen: Unremarkable.  Musculoskeletal: There are no aggressive appearing lytic or blastic lesions noted in the visualized portions  of the skeleton.  Review of the MIP images confirms the above findings.  IMPRESSION: 1. Positive for acute PE with CT evidence of right heartstrain (RV/LV Ratio = 1.02) consistent with at least submassive (intermediate risk) PE. The presence of right heart strain has been associated with anincreased risk of morbidity and mortality. Consultation with Pulmonary and Critical Care Medicine is recommended. 2. The patient has extensive mediastinal and bilateral hilar lymphadenopathy. In addition, there is a nodular opacity in the perihilar aspect of the right lung measuring 12 x 21 mm. It is uncertain whether or not this represents a pulmonary nodule, or is simply an enlarged interlobar lymph node (which is favored). The overall appearance is favored to reflect a lymphoproliferative disorder, and clinical correlation is recommended. Follow-up imaging is suggested to ensure resolution of these findings after appropriate medical therapy. Critical Value/emergent results were called by telephone at the time of interpretation on 06/05/2014 at 7:30 pm to Dr. Pricilla LovelessSCOTT GOLDSTON, who verbally acknowledged these results.   Electronically Signed   By: Trudie Reedaniel  Entrikin M.D.   On: 06/05/2014 19:30   Dg Chest Port 1 View  06/05/2014   CLINICAL DATA:  Shortness of breath and chest pain since this a.m. History of rapid heart rate.  EXAM: PORTABLE CHEST - 1 VIEW  COMPARISON:  08/04/2011  FINDINGS: Prominent right hilar vascular structures but similar to the previous examination. There is no evidence for frank pulmonary edema. Negative for airspace disease. Heart and mediastinum are within normal limits.  IMPRESSION: Prominent central vascular structures, particularly on the right side. Findings could represent vascular congestion. There is not frank pulmonary edema.   Electronically Signed   By: Richarda OverlieAdam  Henn M.D.   On: 06/05/2014 12:25    Microbiology: Recent Results (from the past 240 hour(s))  MRSA PCR SCREENING     Status: None    Collection Time    06/05/14  9:37 PM      Result Value Ref Range Status   MRSA by PCR NEGATIVE  NEGATIVE Final   Comment:            The GeneXpert MRSA Assay (FDA     approved for NASAL specimens     only), is one component of a     comprehensive MRSA colonization     surveillance program. It is not     intended to diagnose MRSA     infection nor to guide or     monitor treatment for     MRSA infections.     Labs: Basic Metabolic Panel:  Recent Labs Lab 06/05/14 1237 06/06/14 0253 06/08/14 0430  NA 138 135* 139  K 3.6* 3.6* 4.1  CL 101 99 103  CO2 23 24 24  GLUCOSE 100* 120* 105*  BUN 12 11 10   CREATININE 0.60 0.59 0.61  CALCIUM 10.2 9.4 9.3   Liver Function Tests:  Recent Labs Lab 06/05/14 1620 06/06/14 0253  AST 30 17  ALT 19 15  ALKPHOS 105 100  BILITOT 0.4 0.5  PROT 7.9 6.9  ALBUMIN 3.8 3.4*    Recent Labs Lab 06/05/14 1620  LIPASE 29   CBC:  Recent Labs Lab 06/05/14 1237 06/06/14 0253 06/07/14 0536 06/08/14 0430  WBC 7.6 6.2 5.4 6.3  HGB 13.3 12.3 11.7* 11.6*  HCT 38.6 33.7* 34.6* 34.2*  MCV 90.8 89.2 90.1 89.3  PLT 236 227 237 252   BNP (last 3 results)  Recent Labs  06/05/14 1237  PROBNP 65.6   CBG:  Recent Labs Lab 06/06/14 0810 06/07/14 0816 06/08/14 0809  GLUCAP 107* 146* 105*    Signed:  Vassie Loll  [facility] 06/08/2014, 1:46 PM

## 2014-06-08 NOTE — Progress Notes (Signed)
Spoke with pt concerning co pay for medication $45 for 30 days, mail in $120 for 90 days. Pt selected Eliquis, MD and Pharm D aware.

## 2014-06-08 NOTE — Progress Notes (Signed)
CARE MANAGEMENT NOTE 06/08/2014  Patient:  Storti,Nicolette   Account Number:  [id number]  Date Initiated:  06/07/2014  Documentation initiated by:  [facility]ESIA  Subjective/Objective Assessment:   pt admitted with cco SOB, Dx P.E.     Action/Plan:   from home   Anticipated DC Date:  06/08/2014   Anticipated DC Plan:  HOME/SELF CARE      DC Planning Services  CM consult      Choice offered to / List presented to:             Status of service:  In process, will continue to follow Medicare Important Message given?   (If response is "NO", the following Medicare IM given date fields will be blank) Date Medicare IM given:   Medicare IM given by:   Date Additional Medicare IM given:   Additional Medicare IM given by:    Discharge Disposition:    Per UR Regulation:  Reviewed for med. necessity/level of care/duration of stay  If discussed at Long Length of Stay Meetings, dates discussed:    Comments:  06/08/14 MMcGibboney, RN, BSN Eliquis and Xarelto both have a co pay of 30 days $45, mail in co pay of 90days $125.00.   06/07/2014 1515 Benefit check sent for copay price for Eliquis or Xarelto. NCM will follow up with attending MD with price of medications. Isidoro DonningAlesia Shavis RN CCM Case Mgmt phone [phone]

## 2014-06-08 NOTE — Progress Notes (Signed)
PER ANISHA N. LOVENOX IS NOT COVERED BUT ENOXAPARIN IS COVERED AND COPAY IS $6.00.

## 2014-06-09 ENCOUNTER — Emergency Department (HOSPITAL_COMMUNITY): Payer: Medicare PPO

## 2014-06-09 ENCOUNTER — Encounter (HOSPITAL_COMMUNITY): Payer: Self-pay | Admitting: Emergency Medicine

## 2014-06-09 ENCOUNTER — Emergency Department (HOSPITAL_COMMUNITY)
Admission: EM | Admit: 2014-06-09 | Discharge: 2014-06-09 | Disposition: A | Discharge status: Home / Self Care | Payer: Medicare PPO | Attending: Emergency Medicine | Admitting: Emergency Medicine

## 2014-06-09 DIAGNOSIS — R200 Anesthesia of skin: Secondary | ICD-10-CM

## 2014-06-09 DIAGNOSIS — Z79899 Other long term (current) drug therapy: Secondary | ICD-10-CM | POA: Diagnosis not present

## 2014-06-09 DIAGNOSIS — R531 Weakness: Secondary | ICD-10-CM | POA: Insufficient documentation

## 2014-06-09 DIAGNOSIS — I10 Essential (primary) hypertension: Secondary | ICD-10-CM | POA: Insufficient documentation

## 2014-06-09 DIAGNOSIS — Z86711 Personal history of pulmonary embolism: Secondary | ICD-10-CM | POA: Insufficient documentation

## 2014-06-09 DIAGNOSIS — M549 Dorsalgia, unspecified: Secondary | ICD-10-CM

## 2014-06-09 DIAGNOSIS — M545 Low back pain: Secondary | ICD-10-CM | POA: Diagnosis not present

## 2014-06-09 DIAGNOSIS — K219 Gastro-esophageal reflux disease without esophagitis: Secondary | ICD-10-CM | POA: Insufficient documentation

## 2014-06-09 DIAGNOSIS — Z880 Allergy status to penicillin: Secondary | ICD-10-CM | POA: Diagnosis not present

## 2014-06-09 DIAGNOSIS — Z7901 Long term (current) use of anticoagulants: Secondary | ICD-10-CM | POA: Diagnosis not present

## 2014-06-09 HISTORY — DX: Sciatica, unspecified side: M54.30

## 2014-06-09 HISTORY — DX: Other pulmonary embolism without acute cor pulmonale: I26.99

## 2014-06-09 LAB — COMPREHENSIVE METABOLIC PANEL
ALBUMIN: 4 g/dL (ref 3.5–5.2)
ALT: 90 U/L — ABNORMAL HIGH (ref 0–35)
AST: 120 U/L — AB (ref 0–37)
Alkaline Phosphatase: 102 U/L (ref 39–117)
Anion gap: 16 — ABNORMAL HIGH (ref 5–15)
BUN: 13 mg/dL (ref 6–23)
CALCIUM: 10 mg/dL (ref 8.4–10.5)
CHLORIDE: 99 meq/L (ref 96–112)
CO2: 22 meq/L (ref 19–32)
Creatinine, Ser: 0.6 mg/dL (ref 0.50–1.10)
GFR calc Af Amer: >90 mL/min (ref >90)
Glucose, Bld: 90 mg/dL (ref 70–99)
Potassium: 4 mEq/L (ref 3.7–5.3)
Sodium: 137 mEq/L (ref 137–147)
Total Bilirubin: 0.4 mg/dL (ref 0.3–1.2)
Total Protein: 8 g/dL (ref 6.0–8.3)

## 2014-06-09 LAB — CBC WITH DIFFERENTIAL/PLATELET
BASOS ABS: 0 10*3/uL (ref 0.0–0.1)
BASOS PCT: 0 % (ref 0–1)
EOS PCT: 3 % (ref 0–5)
Eosinophils Absolute: 0.2 10*3/uL (ref 0.0–0.7)
HEMATOCRIT: 40.1 % (ref 36.0–46.0)
HEMOGLOBIN: 13.6 g/dL (ref 12.0–15.0)
Lymphocytes Relative: 21 % (ref 12–46)
Lymphs Abs: 1.3 10*3/uL (ref 0.7–4.0)
MCH: 31.1 pg (ref 26.0–34.0)
MCHC: 33.9 g/dL (ref 30.0–36.0)
MCV: 91.8 fL (ref 78.0–100.0)
MONO ABS: 0.4 10*3/uL (ref 0.1–1.0)
MONOS PCT: 6 % (ref 3–12)
NEUTROS ABS: 4.4 10*3/uL (ref 1.7–7.7)
Neutrophils Relative %: 70 % (ref 43–77)
Platelets: 234 10*3/uL (ref 150–400)
RBC: 4.37 MIL/uL (ref 3.87–5.11)
RDW: 12.6 % (ref 11.5–15.5)
WBC: 6.3 10*3/uL (ref 4.0–10.5)

## 2014-06-09 LAB — URINALYSIS, ROUTINE W REFLEX MICROSCOPIC
Bilirubin Urine: NEGATIVE
GLUCOSE, UA: NEGATIVE mg/dL
Hgb urine dipstick: NEGATIVE
Ketones, ur: NEGATIVE mg/dL
LEUKOCYTES UA: NEGATIVE
Nitrite: NEGATIVE
Protein, ur: NEGATIVE mg/dL
Specific Gravity, Urine: 1.011 (ref 1.005–1.030)
UROBILINOGEN UA: 0.2 mg/dL (ref 0.0–1.0)
pH: 6.5 (ref 5.0–8.0)

## 2014-06-09 MED ORDER — IBUPROFEN 600 MG PO TABS: 600.0000 mg | ORAL_TABLET | Freq: Three times a day (TID) | ORAL | Status: AC

## 2014-06-09 MED ORDER — APIXABAN 5 MG PO TABS
10.0000 mg | ORAL_TABLET | Freq: Once | ORAL | Status: AC
  Administered 2014-06-09: 10 mg via ORAL
  Filled 2014-06-09: qty 2

## 2014-06-09 NOTE — ED Notes (Signed)
Assisted pt to restroom with use of wheelchair.  

## 2014-06-09 NOTE — ED Notes (Addendum)
Pt from home c/o bilateral leg numbness since  06/07/14. She denies pain but reports it is hard to move legs. She was just discharge from a stay here with diagnosis of PE. She reports  left neck , left chest pain this morning and reports this pain was there when she was diagnosed with PE. She denies  Shortness of breath but is with exertion.

## 2014-06-09 NOTE — ED Notes (Signed)
Pt aware of the need for a urine sample. 

## 2014-06-09 NOTE — ED Provider Notes (Signed)
CSN: [id number]     Arrival date & time 06/09/14  1532 History   First MD Initiated Contact with Patient 06/09/14 1613     No chief complaint on file.    (Consider location/radiation/quality/duration/timing/severity/associated sxs/prior Treatment) HPI  Patient presents today as after discharge, now with concern of left low back pain, dysesthesia in the lower extremities, more prominently on the left. Patient notes that she was here recently after being diagnosed with PE.  Patient has no intrinsic risk factor for this. Patient notes that she was dyspneic, with no chest pain which was found to have pulmonary embolism. The patient states that she has been taking her medication as directed since discharge. Patient's lower extremity symptoms are new. No clear alleviating orogastric factors beyond ambulation, which makes her lower extremities feel more comfortable, with weakness on the left. No new incontinence, abdominal pain, urinary complaints.   Past Medical History  Diagnosis Date  . GERD (gastroesophageal reflux disease)   . Hypertension   . Arthritis   . PE (pulmonary embolism)   . Sciatica    Past Surgical History  Procedure Laterality Date  . Abdominal hysterectomy    . Knee surgery Right   . Abdominal hysterectomy    . Bowel resection    . Foot surgery    . Hand surgery     Family History  Problem Relation Age of Onset  . Colon polyps Father   . Arthritis Father   . Prostate cancer Father   . Heart disease Father   . Hyperlipidemia Father   . Hypertension Mother   . Cancer Mother   . Heart disease Sister   . Kidney disease Sister   . Kidney disease Son   . Diabetes Paternal Uncle   . Diabetes Cousin   . Stroke Other    History  Substance Use Topics  . Smoking status: Never Smoker   . Smokeless tobacco: Never Used  . Alcohol Use: Yes     Comment: wine 1-2 weekly    OB History   Grav Para Term Preterm Abortions TAB SAB Ect Mult Living                  Review of Systems  Constitutional:       Per HPI, otherwise negative  HENT:       Per HPI, otherwise negative  Respiratory:       Per HPI, otherwise negative  Cardiovascular:       Per HPI, otherwise negative  Gastrointestinal: Negative for vomiting.  Endocrine:       Negative aside from HPI  Genitourinary:       Neg aside from HPI   Musculoskeletal:       Per HPI, otherwise negative  Skin: Negative.   Neurological: Positive for weakness and numbness. Negative for syncope.      Allergies  Nutmeg oil (myristica oil); Hydrocodone; Penicillins; and Prednisone  Home Medications   Prior to Admission medications   Medication Sig Start Date End Date Taking? Authorizing Provider  apixaban (ELIQUIS) 5 MG TABS tablet Take 2 tablets (10 mg total) by mouth 2 (two) times daily. 06/08/14 06/14/14 Yes Vassie Lollarlos Madera, MD  Coenzyme Q10 (CO Q 10 PO) Take 1 tablet by mouth daily.   Yes Historical Provider, MD  fish oil-omega-3 fatty acids 1000 MG capsule Take 1 g by mouth daily.    Yes Historical Provider, MD  lisinopril-hydrochlorothiazide (PRINZIDE,ZESTORETIC) 20-12.5 MG per tablet Take 1 tablet by mouth daily.  Yes Historical Provider, MD  Multiple Vitamin (MULTIVITAMIN WITH MINERALS) TABS Take 1 tablet by mouth daily.   Yes Historical Provider, MD  pantoprazole (PROTONIX) 40 MG tablet Take 1 tablet (40 mg total) by mouth daily. 06/08/14  Yes Vassie Lollarlos Madera, MD  VITAMIN K PO Take 1 tablet by mouth daily.   Yes Historical Provider, MD   BP 137/55  Pulse 86  Temp(Src) 97.3 F (36.3 C) (Oral)  Resp 16  Ht 5\' 6"  (1.676 m)  Wt 221 lb (100.245 kg)  BMI 35.69 kg/m2  SpO2 96% Physical Exam  Nursing note and vitals reviewed. Constitutional: She is oriented to person, place, and time. She appears well-developed and well-nourished. No distress.  HENT:  Head: Normocephalic and atraumatic.  Eyes: Conjunctivae and EOM are normal.  Cardiovascular: Normal rate and regular rhythm.    Pulmonary/Chest: Effort normal and breath sounds normal. No stridor. No respiratory distress.  Abdominal: She exhibits no distension.  Musculoskeletal: She exhibits no edema.  Patient has no gross deformities, moves both lower extremities spontaneously.  She has pain with hip flexion on the left, not on the right. Patient has no tenderness to palpation about either hip. Patient's lower extremities have equal circumference.   Neurological: She is alert and oriented to person, place, and time. No cranial nerve deficit.  Patient has appropriate strength, sensation, reflexes in both lower extremities. Speech is clear, face is symmetric  Skin: Skin is warm and dry.  Psychiatric: She has a normal mood and affect.    ED Course  Procedures (including critical care time) Labs Review Labs Reviewed  COMPREHENSIVE METABOLIC PANEL - Abnormal; Notable for the following:    AST 120 (*)    ALT 90 (*)    Anion gap 16 (*)    All other components within normal limits  CBC WITH DIFFERENTIAL  URINALYSIS, ROUTINE W REFLEX MICROSCOPIC    Imaging Review Dg Lumbar Spine Complete  06/09/2014   CLINICAL DATA:  Bilateral leg numbness for 2 days  EXAM: LUMBAR SPINE - COMPLETE 4+ VIEW  COMPARISON:  None.  FINDINGS: Five lumbar type vertebral bodies are noted. Vertebral body height is well maintained. Mild osteophytic changes are seen. No pars defects are noted. No spondylolisthesis is seen. Mild aortic calcifications are noted.  IMPRESSION: Mild degenerative change without acute abnormality.   Electronically Signed   By: Alcide CleverMark  Lukens M.D.   On: 06/09/2014 17:48    I reviewed the patient's electronic medical record, including imaging results from her recent hospitalization. Patient was found to have lymphadenopathy in the thoracic cavity.  Patient was unaware of this finding.  8:04 PM We discussed the findings, reassuring results.  We discussed the utility of CT for further evaluation of possible  pelvic/abdominal lesions, given her new PE, which occurred without clear precipitant, and her lymphadenopathy. Patient voiced a preference for outpatient f/u.   MDM   Final diagnoses:  Back pain    Patient presents with lower extremity dysesthesia, low back discomfort. Patient has a notable history of recent pulmonary embolism.  Today the patient has objectively intact distal neurologic function, no evidence for cauda equina or other decompensation.  Patient does have low back pain, and given her history of sciatica, this may represent musculoskeletal etiology, though with her recent history of PE, lymphadenopathy, patient had x-ray to screen for lytic lesions, metastases. Patient remained hemodynamically stable throughout her course, was comfortable on repeat exam, and was discharged to follow-up with primary care.    Gerhard Munchobert Alejos Reinhardt, MD  06/09/14 2005 

## 2014-06-09 NOTE — Discharge Instructions (Signed)
As discussed, it is important that you follow up as soon as possible with your physician for continued management of your condition. ° °If you develop any new, or concerning changes in your condition, please return to the emergency department immediately. ° °

## 2014-06-09 NOTE — ED Notes (Signed)
Pt calling cab

## 2014-06-10 ENCOUNTER — Ambulatory Visit: Payer: Medicare PPO | Admitting: Dietician

## 2014-06-17 ENCOUNTER — Other Ambulatory Visit: Payer: Self-pay | Admitting: Family Medicine

## 2014-06-17 DIAGNOSIS — M79605 Pain in left leg: Principal | ICD-10-CM

## 2014-06-17 DIAGNOSIS — M79604 Pain in right leg: Secondary | ICD-10-CM

## 2014-06-22 ENCOUNTER — Ambulatory Visit
Admission: RE | Admit: 2014-06-22 | Discharge: 2014-06-22 | Disposition: A | Discharge status: Home / Self Care | Payer: Medicare PPO | Source: Ambulatory Visit | Attending: Family Medicine | Admitting: Family Medicine

## 2014-06-22 DIAGNOSIS — M79604 Pain in right leg: Secondary | ICD-10-CM

## 2014-06-22 DIAGNOSIS — M79605 Pain in left leg: Principal | ICD-10-CM

## 2014-06-24 ENCOUNTER — Telehealth: Payer: Self-pay | Admitting: Internal Medicine

## 2014-06-24 NOTE — Telephone Encounter (Signed)
LEFT MESSAGE FOR PATIENT TO RETURN CALL TO SCHEDULE NP APPT.  °

## 2014-06-25 ENCOUNTER — Ambulatory Visit: Payer: Medicare PPO | Admitting: Dietician

## 2014-06-26 ENCOUNTER — Telehealth: Payer: Self-pay | Admitting: Internal Medicine

## 2014-06-26 NOTE — Telephone Encounter (Signed)
left message for patient to return call to schedule np appt.  °

## 2014-06-30 ENCOUNTER — Telehealth: Payer: Self-pay | Admitting: Internal Medicine

## 2014-06-30 NOTE — Telephone Encounter (Signed)
S/W PATIENT AND GAVE NP APPT FOR 11/23 @ 1:45 W/DR. MOHAMED.  REFERRING DR. RANKINS DX- HILAR LYMPHADENOPATHY WELCOME PACKET MAILED.

## 2014-07-03 ENCOUNTER — Encounter: Payer: Self-pay | Admitting: Neurology

## 2014-07-03 ENCOUNTER — Telehealth: Payer: Self-pay | Admitting: Neurology

## 2014-07-03 ENCOUNTER — Institutional Professional Consult (permissible substitution): Payer: Medicare Other | Admitting: Neurology

## 2014-07-03 ENCOUNTER — Other Ambulatory Visit: Payer: Self-pay | Admitting: Medical Oncology

## 2014-07-03 DIAGNOSIS — R590 Localized enlarged lymph nodes: Secondary | ICD-10-CM

## 2014-07-03 NOTE — Telephone Encounter (Signed)
Patient is a no show for today's appointment   (07/03/14)

## 2014-07-06 ENCOUNTER — Ambulatory Visit (HOSPITAL_BASED_OUTPATIENT_CLINIC_OR_DEPARTMENT_OTHER): Payer: Medicare PPO | Admitting: Internal Medicine

## 2014-07-06 ENCOUNTER — Encounter: Payer: Self-pay | Admitting: Internal Medicine

## 2014-07-06 ENCOUNTER — Ambulatory Visit: Payer: Medicare PPO

## 2014-07-06 ENCOUNTER — Telehealth: Payer: Self-pay | Admitting: Internal Medicine

## 2014-07-06 ENCOUNTER — Encounter (INDEPENDENT_AMBULATORY_CARE_PROVIDER_SITE_OTHER): Payer: Self-pay

## 2014-07-06 ENCOUNTER — Other Ambulatory Visit (HOSPITAL_BASED_OUTPATIENT_CLINIC_OR_DEPARTMENT_OTHER): Payer: Medicare PPO

## 2014-07-06 VITALS — BP 128/62 | HR 76 | Temp 98.0°F | Resp 18 | Ht 66.0 in | Wt 223.1 lb

## 2014-07-06 DIAGNOSIS — R590 Localized enlarged lymph nodes: Secondary | ICD-10-CM

## 2014-07-06 DIAGNOSIS — R591 Generalized enlarged lymph nodes: Secondary | ICD-10-CM

## 2014-07-06 DIAGNOSIS — I2699 Other pulmonary embolism without acute cor pulmonale: Secondary | ICD-10-CM

## 2014-07-06 DIAGNOSIS — Z808 Family history of malignant neoplasm of other organs or systems: Secondary | ICD-10-CM

## 2014-07-06 DIAGNOSIS — Z801 Family history of malignant neoplasm of trachea, bronchus and lung: Secondary | ICD-10-CM

## 2014-07-06 LAB — CBC WITH DIFFERENTIAL/PLATELET
BASO%: 0.7 % (ref 0.0–2.0)
BASOS ABS: 0 10*3/uL (ref 0.0–0.1)
EOS%: 2.7 % (ref 0.0–7.0)
Eosinophils Absolute: 0.2 10*3/uL (ref 0.0–0.5)
HEMATOCRIT: 37.6 % (ref 34.8–46.6)
HGB: 12.5 g/dL (ref 11.6–15.9)
LYMPH#: 1.2 10*3/uL (ref 0.9–3.3)
LYMPH%: 19.8 % (ref 14.0–49.7)
MCH: 30.9 pg (ref 25.1–34.0)
MCHC: 33.2 g/dL (ref 31.5–36.0)
MCV: 93.2 fL (ref 79.5–101.0)
MONO#: 0.5 10*3/uL (ref 0.1–0.9)
MONO%: 8 % (ref 0.0–14.0)
NEUT#: 4.3 10*3/uL (ref 1.5–6.5)
NEUT%: 68.8 % (ref 38.4–76.8)
Platelets: 261 10*3/uL (ref 145–400)
RBC: 4.03 10*6/uL (ref 3.70–5.45)
RDW: 13.2 % (ref 11.2–14.5)
WBC: 6.3 10*3/uL (ref 3.9–10.3)

## 2014-07-06 LAB — COMPREHENSIVE METABOLIC PANEL (CC13)
ALT: 21 U/L (ref 0–55)
AST: 20 U/L (ref 5–34)
Albumin: 3.7 g/dL (ref 3.5–5.0)
Alkaline Phosphatase: 90 U/L (ref 40–150)
Anion Gap: 10 mEq/L (ref 3–11)
BUN: 12.9 mg/dL (ref 7.0–26.0)
CALCIUM: 9.4 mg/dL (ref 8.4–10.4)
CHLORIDE: 106 meq/L (ref 98–109)
CO2: 24 meq/L (ref 22–29)
Creatinine: 0.7 mg/dL (ref 0.6–1.1)
Glucose: 97 mg/dl (ref 70–140)
Potassium: 4.1 mEq/L (ref 3.5–5.1)
Sodium: 140 mEq/L (ref 136–145)
Total Bilirubin: 0.37 mg/dL (ref 0.20–1.20)
Total Protein: 7.1 g/dL (ref 6.4–8.3)

## 2014-07-06 NOTE — Progress Notes (Signed)
[facility] [phone]   Fax:([phone]  CONSULT NOTE  REFERRING PHYSICIAN: Dr. Barbaraann Barthelankins  REASON FOR CONSULTATION:  68 years old white female with mediastinal lymphadenopathy.   HPI Melanie Pittman is a 68 y.o. female with a past medical history significant for hypertension, GERD, arthritis, hypercholesterolemia, sciatica, obstructive sleep apnea, congestive heart failure as well as recently diagnosed pulmonary embolism. The patient mentions that on 06/05/2014 she had sudden shortness of breath with chest pain as well as nausea with no vomiting as well as pain in the left arm. [facility] CT angiogram of the chest was performed and it showed large volume of clot in the distal main pulmonary arteries bilaterally extending onto lobar, segmental and subsegmental-sized pulmonary artery branches bilaterally. The patient has extensive mediastinal and bilateral hilar lymphadenopathy. In addition, there is a nodular opacity in the perihilar aspect of the right lung measuring 12 x 21 mm. It is uncertain whether or not this represents a pulmonary nodule, or is simply an enlarged interlobar lymph node (which is favored). The overall appearance is favored to reflect a lymphoproliferative disorder, and clinical correlation is recommended.  The patient was started on treatment with Eliquis 5 mg by mouth twice a day. She is tolerating it well with no significant bleeding issues.  She was referred to me today for evaluation of the mediastinal lymphadenopathy. When seen today the patient has no significant complaints except for aching pain in the back and legs as well as numbness in the fingers. She has a weight loss of around 15 pounds in the last few months. She denied having any significant chest pain, shortness of breath, cough or hemoptysis. She has no nausea or vomiting, no fever or chills, no headache or blurry  vision. Family history significant for father with prostate cancer and mother with lung cancer. The patient is single and has 4 children. She was accompanied today by her daughter Melanie Pittman. 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 She has no history of smoking and drinks alcohol occasionally with no history of drug abuse.  HPI  Past Medical History  Diagnosis Date  . GERD (gastroesophageal reflux disease)   . Hypertension   . Arthritis   . PE (pulmonary embolism)   . Sciatica   . Vertigo   . Depression   . Anxiety   . Meniere disease   . OSA (obstructive sleep apnea)     Past Surgical History  Procedure Laterality Date  . Abdominal hysterectomy    . Knee surgery Right   . Abdominal hysterectomy    . Bowel resection    . Foot surgery    . Hand surgery      Family History  Problem Relation Age of Onset  . Colon polyps Father   . Arthritis Father   . Prostate cancer Father   . Heart disease Father   . Hyperlipidemia Father   . Hypertension Mother   . Cancer Mother     lung,breast  . Heart disease Sister   . Kidney disease Sister   . Cancer Sister     ovarian  . Kidney disease Son   . Diabetes Paternal Uncle   . Diabetes Cousin   . Stroke Other     Social History History  Substance Use Topics  . Smoking status: Never Smoker   . Smokeless tobacco: Never Used  . Alcohol Use: Yes     Comment: wine 1-2 weekly  Allergies  Allergen Reactions  . Nutmeg Oil (Myristica Oil) Itching  . Hydrocodone Palpitations  . Penicillins Rash  . Prednisone Palpitations    Current Outpatient Prescriptions  Medication Sig Dispense Refill  . apixaban (ELIQUIS) 5 MG TABS tablet Take 2 tablets (10 mg total) by mouth 2 (two) times daily. 72 tablet 0  . Coenzyme Q10 (CO Q 10 PO) Take 1 tablet by mouth daily.    . fish oil-omega-3 fatty acids 1000 MG capsule Take 1 g by mouth daily.     Marland Kitchen lisinopril-hydrochlorothiazide (PRINZIDE,ZESTORETIC) 20-12.5 MG per tablet Take 1 tablet by  mouth daily.    . Multiple Vitamin (MULTIVITAMIN WITH MINERALS) TABS Take 1 tablet by mouth daily.    . pantoprazole (PROTONIX) 40 MG tablet Take 1 tablet (40 mg total) by mouth daily. 30 tablet 1  . esomeprazole (NEXIUM) 40 MG capsule Take 40 mg by mouth daily as needed.     No current facility-administered medications for this visit.    Review of Systems  Constitutional: positive for weight loss Eyes: negative Ears, nose, mouth, throat, and face: negative Respiratory: negative Cardiovascular: negative Gastrointestinal: negative Genitourinary:negative Integument/breast: negative Hematologic/lymphatic: negative Musculoskeletal:positive for arthralgias Neurological: negative Behavioral/Psych: negative Endocrine: negative Allergic/Immunologic: negative  Physical Exam  WUJ:WJXBJ, healthy, no distress, well nourished and well developed SKIN: skin color, texture, turgor are normal, no rashes or significant lesions HEAD: Normocephalic, No masses, lesions, tenderness or abnormalities EYES: normal, PERRLA EARS: External ears normal, Canals clear OROPHARYNX:no exudate, no erythema and lips, buccal mucosa, and tongue normal  NECK: supple, no adenopathy, no JVD LYMPH:  no palpable lymphadenopathy, no hepatosplenomegaly BREAST:not examined LUNGS: clear to auscultation , and palpation HEART: regular rate & rhythm and no murmurs ABDOMEN:abdomen soft, non-tender, obese, normal bowel sounds and no masses or organomegaly BACK: Back symmetric, no curvature., No CVA tenderness EXTREMITIES:no joint deformities, effusion, or inflammation, no edema, no skin discoloration  NEURO: alert & oriented x 3 with fluent speech, no focal motor/sensory deficits  PERFORMANCE STATUS: ECOG 1  LABORATORY DATA: Lab Results  Component Value Date   WBC 6.3 07/06/2014   HGB 12.5 07/06/2014   HCT 37.6 07/06/2014   MCV 93.2 07/06/2014   PLT 261 07/06/2014      Chemistry      Component Value Date/Time    NA 140 07/06/2014 1421   NA 137 06/09/2014 1707   K 4.1 07/06/2014 1421   K 4.0 06/09/2014 1707   CL 99 06/09/2014 1707   CO2 24 07/06/2014 1421   CO2 22 06/09/2014 1707   BUN 12.9 07/06/2014 1421   BUN 13 06/09/2014 1707   CREATININE 0.7 07/06/2014 1421   CREATININE 0.60 06/09/2014 1707      Component Value Date/Time   CALCIUM 9.4 07/06/2014 1421   CALCIUM 10.0 06/09/2014 1707   ALKPHOS 90 07/06/2014 1421   ALKPHOS 102 06/09/2014 1707   AST 20 07/06/2014 1421   AST 120* 06/09/2014 1707   ALT 21 07/06/2014 1421   ALT 90* 06/09/2014 1707   BILITOT 0.37 07/06/2014 1421   BILITOT 0.4 06/09/2014 1707       RADIOGRAPHIC STUDIES: Dg Lumbar Spine Complete  06/09/2014   CLINICAL DATA:  Bilateral leg numbness for 2 days  EXAM: LUMBAR SPINE - COMPLETE 4+ VIEW  COMPARISON:  None.  FINDINGS: Five lumbar type vertebral bodies are noted. Vertebral body height is well maintained. Mild osteophytic changes are seen. No pars defects are noted. No spondylolisthesis is seen. Mild aortic calcifications are noted.  IMPRESSION: Mild degenerative change without acute abnormality.   Electronically Signed   By: Alcide CleverMark  Lukens M.D.   On: 06/09/2014 17:48   Mr Lumbar Spine Wo Contrast  06/22/2014   CLINICAL DATA:  Bilateral leg pain. Recent diagnosis of pulmonary embolism.  EXAM: MRI LUMBAR SPINE WITHOUT CONTRAST  TECHNIQUE: Multiplanar, multisequence MR imaging of the lumbar spine was performed. No intravenous contrast was administered.  COMPARISON:  Lumbar MRI 11/19/2009  FINDINGS: Normal lumbar alignment. Negative for fracture or mass. No hematoma or fluid collection. Conus medullaris is normal and terminates at L1  L1-2:  Negative  L2-3:  Negative  L3-4: Mild disc bulging and mild facet degeneration. Mild narrowing of the canal without significant stenosis  L4-5: Diffuse disc bulging. Mild bilateral facet hypertrophy. Mild to moderate spinal stenosis has progressed. Subarticular stenosis is present  bilaterally. Foraminal narrowing bilaterally is present.  L5-S1: Disc degeneration and spondylosis similar to the prior study. This is causing moderate foraminal narrowing bilaterally which is unchanged.  IMPRESSION: Mild to moderate spinal stenosis at L4-5 has progressed since 2011.  Spondylosis and foraminal encroachment bilaterally L5-S1 similar to the prior study.   Electronically Signed   By: Marlan Palauharles  Clark M.D.   On: 06/22/2014 15:55    ASSESSMENT: This is a very pleasant 68 years old white female who was recently diagnosed with pulmonary embolism with incidental findings of mediastinal lymphadenopathy suspicious for lymphoproliferative disorder (lymphoma) versus other neoplasm versus inflammatory process.   PLAN: I had a lengthy discussion with the patient and her daughter today about her current condition and further investigation to evaluate the mediastinal lymphadenopathy.  I recommended for the patient to have a PET scan performed for evaluation of the mediastinal lymph nodes and if the PET scan is positive the patient would be considered for tissue diagnosis with either endoscopic bronchoscopy with ultrasound or mediastinoscopy. I would see the patient back for follow-up visit in 2 weeks for reevaluation and discussion of the PET scan results and further recommendation regarding her condition. For the recently diagnosed pulmonary embolism, the patient will continue on Eliquis 5 mg by mouth twice a day. The patient was advised to call immediately if she has any concerning symptoms in the interval. The patient voices understanding of current disease status and treatment options and is in agreement with the current care plan.  All questions were answered. The patient knows to call the clinic with any problems, questions or concerns. We can certainly see the patient much sooner if necessary.  Thank you so much for allowing me to participate in the care of Melanie Pittman. I will continue to  follow up the patient with you and assist in her care.  I spent 40 minutes counseling the patient face to face. The total time spent in the appointment was 60 minutes.  Disclaimer: This note was dictated with voice recognition software. Similar sounding words can inadvertently be transcribed and may not be corrected upon review.   Blakeley Scheier K. 07/06/2014, 3:33 PM

## 2014-07-06 NOTE — Progress Notes (Signed)
Checked in new patient with no issues prior to seeing the dr. She has appt card and has not been traveling.  °

## 2014-07-06 NOTE — Telephone Encounter (Signed)
Gave avs & cal for Dec. °

## 2014-07-08 ENCOUNTER — Encounter: Payer: Medicare PPO | Attending: Family Medicine | Admitting: Dietician

## 2014-07-08 VITALS — Ht 66.0 in | Wt 223.2 lb

## 2014-07-08 DIAGNOSIS — Z6836 Body mass index (BMI) 36.0-36.9, adult: Secondary | ICD-10-CM | POA: Diagnosis not present

## 2014-07-08 DIAGNOSIS — E669 Obesity, unspecified: Secondary | ICD-10-CM

## 2014-07-08 DIAGNOSIS — Z713 Dietary counseling and surveillance: Secondary | ICD-10-CM | POA: Insufficient documentation

## 2014-07-08 NOTE — Progress Notes (Signed)
  Medical Nutrition Therapy:  Appt start time: 1030 end time:  1100.   Assessment:  Primary concerns today: Follow up for obesity/GI problems. Patient would like to lose weight. Previously saw Luther ParodyMegan Hadley, RD. Has been sick lately and has not had an appetite. Was in the hospital d/t shortness of breath and blood clot in her lung in October. Has tests scheduled to see if she has lung cancer or lymphoma. Mother died of lung cancer.  Had lost weight d/t decreased appetite but has recently gained better since appetite has improved. Weight is down about 3 lbs since appointment on 03/11/2014.  Eats very little meat and prefers nuts and vegetables (Mediterranean Diet).    Preferred Learning Style:   No preference indicated   Learning Readiness:   Change in progress   MEDICATIONS: See chart   DIETARY INTAKE:  Usual eating pattern includes 2 meals and 0-1 snacks per day.  Everyday foods include Fish, tuna, salmon, chicken.  Avoided foods include: Lactose intolerant-tolerates yogurt, doesn't digest fatty foods very well. Gets very bloated when eats anything that has milk in it. Doesn't eat a lot of meat-difficulty digesting beef. Eats sourdough bread - doesn't feel good after eating regular bread.   24-hr recall:  Hot tea - sugar - splenda and coconut milk or lactose free milk B ( AM): Coffee - splenda & coconut milk or lactose free milk (sometimes doesn't tolerate),  Oatmeal, scrambled eggs, might have 1 slice of sour dough bread with smart balance with fruit or smoothies (16 oz mostly fruit) Snk ( AM): 2 clementines L ( PM): none Snk ( PM): none D (2-4 PM): Salad-lettuce, tomatoes, olives, onions, nuts, meat, feta - basalmic vinegar/oil or sweet potato/brown rice or spaghetti squash with Malawiturkey meatballs with bread Sweet tea 20 oz with ice or coffee. Snk ( PM): cup of tea - usually one before bed Beverages: hot tea, coffee, sweet tea with sugar, smoothies (1-2 x week) mango or apple juice  (not as much)  Usual physical activity: [employer] - walking, stretching in the water 5 times a week (not able to do intense exercise)  Estimated energy needs: 1800 calories 200 g carbohydrates 135 g protein 50 g fat  Progress Towards Goal(s):  No progress.   Nutritional Diagnosis:  [employer]-3.3 Overweight/obesity As related to meal skipping and previous diet high in sugar and fat.  As evidenced by patient report and BMI greater than 30.    Intervention:  Nutrition Education & Counseling. Plan:  Decrease carbohydrate in smoothie - 1 cup of fruit, add a protein (yogurt, peanut butter, milk) and add ice or water (think about adding vegetables)  Limit juice  Continue exercising 5 days per week (as tolerated)   For breakfast - always have protein with carbohydrate (cottage cheese with fruit or toast, yogurt with fruit, eggs with vegetables and toast, peanut butter sandwich)   For cereal, have regular Cheerios or oatmeal   Try a protein shake (Premier, Atkins, EAS) if you are likely to skip a meal (early morning breakfast or dinner).  Teaching Method Utilized:  Auditory   Barriers to learning/adherence to lifestyle change: ulcerated spine - physical activity and recent blood clot in her lung Demonstrated degree of understanding via:  Teach Back   Monitoring/Evaluation:  Dietary intake, exercise, and body weight in 6 week(s).

## 2014-07-08 NOTE — Patient Instructions (Addendum)
Plan:  Decrease carbohydrate in smoothie - 1 cup of fruit, add a protein (yogurt, peanut butter, milk) and add ice or water (think about adding vegetables)  Limit juice  Continue exercising 5 days per week (as tolerated)   For breakfast - always have protein with carbohydrate (cottage cheese with fruit or toast, yogurt with fruit, eggs with vegetables and toast, peanut butter sandwich)   For cereal, have regular Cheerios or oatmeal   Try a protein shake (Premier, Atkins, EAS) if you are likely to skip a meal (early morning breakfast or dinner).

## 2014-07-14 ENCOUNTER — Ambulatory Visit (HOSPITAL_COMMUNITY)
Admission: RE | Admit: 2014-07-14 | Discharge: 2014-07-14 | Disposition: A | Discharge status: Home / Self Care | Payer: Medicare PPO | Source: Ambulatory Visit | Attending: Internal Medicine | Admitting: Internal Medicine

## 2014-07-14 ENCOUNTER — Encounter (HOSPITAL_COMMUNITY): Payer: Self-pay

## 2014-07-14 DIAGNOSIS — R599 Enlarged lymph nodes, unspecified: Secondary | ICD-10-CM | POA: Diagnosis not present

## 2014-07-14 DIAGNOSIS — R590 Localized enlarged lymph nodes: Secondary | ICD-10-CM

## 2014-07-14 LAB — GLUCOSE, CAPILLARY: Glucose-Capillary: 112 mg/dL — ABNORMAL HIGH (ref 70–99)

## 2014-07-14 MED ORDER — FLUDEOXYGLUCOSE F - 18 (FDG) INJECTION
11.1200 | Freq: Once | INTRAVENOUS | Status: AC | PRN
  Administered 2014-07-14: 11.12 via INTRAVENOUS

## 2014-07-16 ENCOUNTER — Institutional Professional Consult (permissible substitution): Payer: Medicare Other | Admitting: Neurology

## 2014-07-16 ENCOUNTER — Telehealth: Payer: Self-pay | Admitting: Neurology

## 2014-07-16 NOTE — Telephone Encounter (Signed)
Patient is a no show for today's appointment @9 :30-(07/16/14)

## 2014-07-20 ENCOUNTER — Encounter: Payer: Self-pay | Admitting: Neurology

## 2014-07-21 ENCOUNTER — Telehealth: Payer: Self-pay | Admitting: Internal Medicine

## 2014-07-21 ENCOUNTER — Ambulatory Visit (HOSPITAL_BASED_OUTPATIENT_CLINIC_OR_DEPARTMENT_OTHER): Payer: Medicare PPO | Admitting: Internal Medicine

## 2014-07-21 ENCOUNTER — Encounter: Payer: Self-pay | Admitting: Internal Medicine

## 2014-07-21 VITALS — BP 134/66 | HR 95 | Temp 97.6°F | Resp 18 | Ht 66.0 in | Wt 220.2 lb

## 2014-07-21 DIAGNOSIS — R590 Localized enlarged lymph nodes: Secondary | ICD-10-CM

## 2014-07-21 DIAGNOSIS — Z23 Encounter for immunization: Secondary | ICD-10-CM

## 2014-07-21 DIAGNOSIS — R591 Generalized enlarged lymph nodes: Secondary | ICD-10-CM

## 2014-07-21 MED ORDER — INFLUENZA VAC SPLIT QUAD 0.5 ML IM SUSY
0.5000 mL | PREFILLED_SYRINGE | Freq: Once | INTRAMUSCULAR | Status: AC
  Administered 2014-07-21: 0.5 mL via INTRAMUSCULAR
  Filled 2014-07-21: qty 0.5

## 2014-07-21 NOTE — Telephone Encounter (Signed)
gv and printed appt sched and avs for pt for Dec...Marland Kitchen.per Dr. Kerry FortMM he has contacted Cardiothoracic and they will contact pt with appt

## 2014-07-21 NOTE — Patient Instructions (Signed)
Avian Influenza Viruses Avian influenza or "bird flu" is also known as the H5N1 virus. It occurs naturally in wild and domestic birds. Bird flu is easily spread (contagious) among birds and is deadly to them. Though rare, bird flu can cause disease in humans.  CAUSES  Infected birds can shed the H5N1 virus through:   Feces.  Nasal secretions.  Saliva. Birds become infected when they come into contact with infected birds or contaminated surfaces. The bird flu virus is spread from country to country through international poultry trade or by migrating birds.  MODES OF TRANSMISSION TO HUMANS The bird flu virus does not normally infect humans. However, the virus can infect humans who have contact with infected birds, breathe in dust or touch surfaces contaminated with the virus. Human-to-human transmission of the H5N1 virus has been rare. The virus lacks the ability to grow itself (replicate) in humans. However, because all influenza viruses can mutate, scientists are concerned the H5N1 virus will someday replicate itself and make human-to-human transmission easier. If this happens, an influenza "pandemic" could occur.  SYMPTOMS   Symptoms of H5N1 virus are similar to other influenza viruses:  Fever.  Cough.  Sore throat.  Nausea and vomiting.  Diarrhea.  Muscle aches.  Tiredness (malaise).  Some people may get inflammation or redness of the eyes (conjunctivitis).  Life-threatening complications may result in the death of the patient. These include:  Viral pneumonia.  Breathing (respiratory) distress syndrome.  Multi-organ failure. DIAGNOSIS   A person with a respiratory illness may be suffering from bird flu if direct or indirect contact has been made with infected birds. This includes handling or taking care of sick birds. The H5N1 virus may also be suspected if a person has breathed in particles or touched surfaces contaminated with the virus.  In addition to the above  symptoms, a chest X-ray is useful to detect pneumonia.  Fluid specimens such as a sputum sample may be sent to a laboratory for further investigation.  Blood tests may be done to help detect the H5N1 virus. TREATMENT   The H5N1 virus has shown resistance to amantadine and rimantadine, which are two antiviral drugs commonly used for other influenza viruses. However, two other antivirals, oseltamivir and zanamivir, seem to be effective against the H5N1 strain.  If bird flu is suspected in a person, treatment should start immediately without waiting for laboratory confirmation.  Treatment for the H5N1 strain is essentially the same as treating other influenza viruses. PREVENTION   Stay home from work, school, and errands when you are sick. Not being in contact with other people will help stop the spread of illness.  Cover your mouth and nose with your arm when coughing or sneezing. This may help keep those around you from getting sick.  Wash your hands often with warm water and soap. Illnesses are often spread when a person touches something that is contaminated with germs and then touches his or her eyes, nose, or mouth.  Antiviral medications can help prevent the flu.  For optimal health, get plenty of rest, eat a healthy diet, and exercise. Document Released: 08/03/2003 Document Revised: 12/15/2013 Document Reviewed: 02/27/2008 ExitCare Patient Information 2015 [employer]. This information is not intended to replace advice given to you by your health care provider. Make sure you discuss any questions you have with your health care provider.  

## 2014-07-21 NOTE — Progress Notes (Signed)
[facility] [phone]   Fax:([phone]  OFFICE PROGRESS NOTE  Beverley FiedlerANKINS,VICTORIA, MD [address] [place] [address]  DIAGNOSIS: Mediastinal lymphadenopathy concerning for lymphoproliferative disorder.  PRIOR THERAPY: None   CURRENT THERAPY: None  INTERVAL HISTORY: Melanie Pittman 68 y.o. female returns to the clinic today for follow-up visit. The patient is feeling fine today was no specific complaints except for mild shortness of breath and cough. She denied having any significant chest pain or hemoptysis. The patient denied having any significant weight loss or night sweats. She has no nausea or vomiting, no fever or chills. She was seen 2 weeks ago for evaluation of mediastinal lymphadenopathy seen on CT scan of the chest. I ordered a PET scan which was performed recently and the patient is here today for evaluation and discussion of her PET scan results and further recommendation regarding her condition.  MEDICAL HISTORY: Past Medical History  Diagnosis Date  . GERD (gastroesophageal reflux disease)   . Hypertension   . Arthritis   . PE (pulmonary embolism)   . Sciatica   . Vertigo   . Depression   . Anxiety   . Meniere disease   . OSA (obstructive sleep apnea)     ALLERGIES:  is allergic to nutmeg oil (myristica oil); hydrocodone; penicillins; and prednisone.  MEDICATIONS:  Current Outpatient Prescriptions  Medication Sig Dispense Refill  . apixaban (ELIQUIS) 5 MG TABS tablet Take 2 tablets (10 mg total) by mouth 2 (two) times daily. 72 tablet 0  . Coenzyme Q10 (CO Q 10 PO) Take 1 tablet by mouth daily.    Marland Kitchen. esomeprazole (NEXIUM) 40 MG capsule Take 40 mg by mouth daily as needed.    . fish oil-omega-3 fatty acids 1000 MG capsule Take 1 g by mouth daily.     Marland Kitchen. lisinopril-hydrochlorothiazide (PRINZIDE,ZESTORETIC) 20-12.5 MG per tablet Take 1 tablet by mouth daily.    . Multiple Vitamin (MULTIVITAMIN WITH MINERALS) TABS Take 1  tablet by mouth daily.     Current Facility-Administered Medications  Medication Dose Route Frequency Provider Last Rate Last Dose  . Influenza vac split quadrivalent PF (FLUARIX) injection 0.5 mL  0.5 mL Intramuscular Once Si GaulMohamed Brenda Cowher, MD        SURGICAL HISTORY:  Past Surgical History  Procedure Laterality Date  . Abdominal hysterectomy    . Knee surgery Right   . Abdominal hysterectomy    . Bowel resection    . Foot surgery    . Hand surgery      REVIEW OF SYSTEMS:  A comprehensive review of systems was negative except for: Respiratory: positive for cough and dyspnea on exertion   PHYSICAL EXAMINATION: General appearance: alert, cooperative, fatigued and no distress Head: Normocephalic, without obvious abnormality, atraumatic Neck: no adenopathy, no JVD, supple, symmetrical, trachea midline and thyroid not enlarged, symmetric, no tenderness/mass/nodules Lymph nodes: Cervical, supraclavicular, and axillary nodes normal. Resp: clear to auscultation bilaterally Back: symmetric, no curvature. ROM normal. No CVA tenderness. Cardio: regular rate and rhythm, S1, S2 normal, no murmur, click, rub or gallop GI: soft, non-tender; bowel sounds normal; no masses,  no organomegaly Extremities: extremities normal, atraumatic, no cyanosis or edema  ECOG PERFORMANCE STATUS: 1 - Symptomatic but completely ambulatory  Blood pressure 134/66, pulse 95, temperature 97.6 F (36.4 C), temperature source Oral, resp. rate 18, height 5\' 6"  (1.676 m), weight 220 lb 3.2 oz (99.882 kg), SpO2 100 %.  LABORATORY DATA: Lab Results  Component Value Date  WBC 6.3 07/06/2014   HGB 12.5 07/06/2014   HCT 37.6 07/06/2014   MCV 93.2 07/06/2014   PLT 261 07/06/2014      Chemistry      Component Value Date/Time   NA 140 07/06/2014 1421   NA 137 06/09/2014 1707   K 4.1 07/06/2014 1421   K 4.0 06/09/2014 1707   CL 99 06/09/2014 1707   CO2 24 07/06/2014 1421   CO2 22 06/09/2014 1707   BUN 12.9  07/06/2014 1421   BUN 13 06/09/2014 1707   CREATININE 0.7 07/06/2014 1421   CREATININE 0.60 06/09/2014 1707      Component Value Date/Time   CALCIUM 9.4 07/06/2014 1421   CALCIUM 10.0 06/09/2014 1707   ALKPHOS 90 07/06/2014 1421   ALKPHOS 102 06/09/2014 1707   AST 20 07/06/2014 1421   AST 120* 06/09/2014 1707   ALT 21 07/06/2014 1421   ALT 90* 06/09/2014 1707   BILITOT 0.37 07/06/2014 1421   BILITOT 0.4 06/09/2014 1707       RADIOGRAPHIC STUDIES: Mr Lumbar Spine Wo Contrast  06/22/2014   CLINICAL DATA:  Bilateral leg pain. Recent diagnosis of pulmonary embolism.  EXAM: MRI LUMBAR SPINE WITHOUT CONTRAST  TECHNIQUE: Multiplanar, multisequence MR imaging of the lumbar spine was performed. No intravenous contrast was administered.  COMPARISON:  Lumbar MRI 11/19/2009  FINDINGS: Normal lumbar alignment. Negative for fracture or mass. No hematoma or fluid collection. Conus medullaris is normal and terminates at L1  L1-2:  Negative  L2-3:  Negative  L3-4: Mild disc bulging and mild facet degeneration. Mild narrowing of the canal without significant stenosis  L4-5: Diffuse disc bulging. Mild bilateral facet hypertrophy. Mild to moderate spinal stenosis has progressed. Subarticular stenosis is present bilaterally. Foraminal narrowing bilaterally is present.  L5-S1: Disc degeneration and spondylosis similar to the prior study. This is causing moderate foraminal narrowing bilaterally which is unchanged.  IMPRESSION: Mild to moderate spinal stenosis at L4-5 has progressed since 2011.  Spondylosis and foraminal encroachment bilaterally L5-S1 similar to the prior study.   Electronically Signed   By: Marlan Palauharles  Clark M.D.   On: 06/22/2014 15:55   Nm Pet Image Initial (pi) Skull Base To Thigh  07/14/2014   CLINICAL DATA:  Initial treatment strategy for mediastinal lymphadenopathy. No given history of malignancy. Initial encounter.  EXAM: NUCLEAR MEDICINE PET SKULL BASE TO THIGH  TECHNIQUE: 11.12 mCi F-18 FDG  was injected intravenously. Full-ring PET imaging was performed from the skull base to thigh after the radiotracer. CT data was obtained and used for attenuation correction and anatomic localization.  FASTING BLOOD GLUCOSE:  Value: 112 mg/dl  COMPARISON:  Chest CT 06/05/2014.  Lumbar MRI 06/22/2014.  FINDINGS: NECK  There is a single hypermetabolic left supraclavicular lymph node which measures 8 mm short axis on image 42 and has an SUV max of 5.1. No other hypermetabolic cervical lymph nodes are demonstrated.There are no lesions of the pharyngeal mucosal space.  CHEST  The enlarged mediastinal and hilar lymph nodes demonstrated on previous CT are hypermetabolic. 12 mm right paratracheal node on image 64 has an SUV max of 6.4. Right hilar nodes demonstrate an SUV max of 9.3 and left hilar nodes an SUV max of 8.2. 9 mm node posterior to the descending aorta on image 85 has an SUV max of 8.7. There is no hypermetabolic pulmonary activity or suspicious pulmonary nodule. Mild emphysematous changes are present. No specific pulmonary findings of sarcoidosis identified.  ABDOMEN/PELVIS  Small portacaval nodes are mildly  hypermetabolic (SUV max 4.8). No other hypermetabolic lymph nodes are identified within the abdomen or pelvis. There is no abnormal metabolic activity within the liver, spleen, adrenal glands or pancreas. Images through the pelvis demonstrate probable pelvic floor laxity status post hysterectomy.  SKELETON  There is no hypermetabolic activity to suggest osseous metastatic disease.  IMPRESSION: 1. Multifocal hypermetabolic adenopathy within the mediastinum and hilar stations, similar to chest CT performed approximately 5 weeks ago. There is a single mildly hypermetabolic left supraclavicular node as well as mildly hypermetabolic lymph nodes in the portacaval space. These nodes are nonspecific and could indicate a lymphoproliferative or inflammatory process. Tissue sampling should be considered. If not  performed, short-term follow-up imaging recommended to assess stability. 2. No hypermetabolic pulmonary lesions identified. 3. No suspicious extra nodal activity within the abdomen or pelvis.   Electronically Signed   By: Roxy Horseman M.D.   On: 07/14/2014 13:44    ASSESSMENT AND PLAN: This is a very pleasant 68 years old white female who presented with hypermetabolic mediastinal lymphadenopathy suspicious for myeloproliferative disorder. I discussed the PET scan results and showed the images to the patient today. I recommended for her to see a cardiothoracic surgeon for consideration of bronchoscopy, EBUS +/- mediastinoscopy for tissue diagnosis. I will see her back for follow-up visit in 2 weeks for evaluation after her biopsy. She was advised to call immediately if she has any concerning symptoms in the interval. The patient voices understanding of current disease status and treatment options and is in agreement with the current care plan.  All questions were answered. The patient knows to call the clinic with any problems, questions or concerns. We can certainly see the patient much sooner if necessary.  Disclaimer: This note was dictated with voice recognition software. Similar sounding words can inadvertently be transcribed and may not be corrected upon review.

## 2014-07-23 ENCOUNTER — Telehealth: Payer: Self-pay | Admitting: Medical Oncology

## 2014-07-23 ENCOUNTER — Other Ambulatory Visit: Payer: Self-pay | Admitting: Medical Oncology

## 2014-07-23 DIAGNOSIS — R590 Localized enlarged lymph nodes: Secondary | ICD-10-CM

## 2014-07-23 NOTE — Telephone Encounter (Signed)
Asking about bx. [facility].

## 2014-07-23 NOTE — Telephone Encounter (Signed)
Per Dr Arbutus PedMohamed . Cardiothoracic surgery wil call her.  Pt said they did and appt is 12/17

## 2014-07-30 ENCOUNTER — Telehealth: Payer: Self-pay | Admitting: *Deleted

## 2014-07-30 ENCOUNTER — Ambulatory Visit (INDEPENDENT_AMBULATORY_CARE_PROVIDER_SITE_OTHER): Payer: Medicare PPO | Admitting: Thoracic Surgery (Cardiothoracic Vascular Surgery)

## 2014-07-30 ENCOUNTER — Encounter: Payer: Self-pay | Admitting: Thoracic Surgery (Cardiothoracic Vascular Surgery)

## 2014-07-30 ENCOUNTER — Ambulatory Visit: Payer: Medicare PPO | Attending: Thoracic Surgery (Cardiothoracic Vascular Surgery) | Admitting: Physical Therapy

## 2014-07-30 ENCOUNTER — Other Ambulatory Visit: Payer: Self-pay | Admitting: *Deleted

## 2014-07-30 VITALS — BP 143/66 | HR 105 | Temp 97.6°F | Resp 19 | Ht 66.5 in | Wt 221.1 lb

## 2014-07-30 DIAGNOSIS — R0602 Shortness of breath: Secondary | ICD-10-CM

## 2014-07-30 DIAGNOSIS — R599 Enlarged lymph nodes, unspecified: Secondary | ICD-10-CM | POA: Insufficient documentation

## 2014-07-30 DIAGNOSIS — I10 Essential (primary) hypertension: Secondary | ICD-10-CM | POA: Insufficient documentation

## 2014-07-30 DIAGNOSIS — G4730 Sleep apnea, unspecified: Secondary | ICD-10-CM

## 2014-07-30 DIAGNOSIS — F419 Anxiety disorder, unspecified: Secondary | ICD-10-CM

## 2014-07-30 DIAGNOSIS — R590 Localized enlarged lymph nodes: Secondary | ICD-10-CM

## 2014-07-30 DIAGNOSIS — K219 Gastro-esophageal reflux disease without esophagitis: Secondary | ICD-10-CM | POA: Insufficient documentation

## 2014-07-30 DIAGNOSIS — I2699 Other pulmonary embolism without acute cor pulmonale: Secondary | ICD-10-CM

## 2014-07-30 NOTE — Therapy (Signed)
[facility] [address] [place], [place], [address] Phone: [phone]   Fax:  [phone]  Physical Therapy Evaluation  Patient Details  Name: Melanie Pittman MRN: [id number] Date of Birth: 09/13/1945  Encounter Date: 07/30/2014      PT End of Session - 07/30/14 1642    Visit Number 1   Number of Visits 1   Date for PT Re-Evaluation 09/29/14   PT Start Time 1605   PT Stop Time 1628   PT Time Calculation (min) 23 min      Past Medical History  Diagnosis Date  . GERD (gastroesophageal reflux disease)   . Hypertension   . Arthritis   . PE (pulmonary embolism)   . Sciatica   . Vertigo   . Depression   . Anxiety   . Meniere disease   . OSA (obstructive sleep apnea)     Past Surgical History  Procedure Laterality Date  . Abdominal hysterectomy    . Knee surgery Right   . Abdominal hysterectomy    . Bowel resection    . Foot surgery    . Hand surgery      There were no vitals taken for this visit.  Visit Diagnosis:  Exertional shortness of breath - Plan: PT plan of care cert/re-cert      Subjective Assessment - 07/30/14 1632    Symptoms Pt. reports a variety of symptoms:  right arm pain and numbness, bilateral hand numbness, back problems with pain; LE (thigh muscle) weakness with difficulty standing from sitting at times.   Pertinent History [facility], chest pain, and nausea; workup CT Angiogram showed a PE and mediastinal and bilateral hilar lymphadenopathy; has not had tissue biopsy yet; may have lymphoma.                                              Currently in Pain? No/denies          [facility] PT Assessment - 07/30/14 0001    Assessment   Medical Diagnosis thoracic adenopathy with as yet undetermined pathology, suspicious for lymphoma   Precautions   Precautions Other (comment)   Precaution Comments cancer precautions   Restrictions   Weight Bearing  Restrictions No   Balance Screen   Has the patient fallen in the past 6 months No   Has the patient had a decrease in activity level because of a fear of falling?  Yes  due to perceived thigh muscle weakness and legs giving way   Is the patient reluctant to leave their home because of a fear of falling?  No   Home Environment   Living Enviornment Private residence   Living Arrangements Alone  with dog   Type of Home House   Home Access Stairs to enter   Entrance Stairs-Number of Steps 3   Home Layout One level   Prior Function   Level of Independence Independent with basic ADLs;Independent with homemaking with ambulation;Independent with gait   Sensation   Light Touch Not tested  has foot pain at night; gets hand numbness (4th, 5th digits)   Posture/Postural Control   Posture/Postural Control Postural limitations   Postural Limitations Rounded Shoulders;Forward head  slight   AROM   Overall AROM  Within functional limits for tasks performed  standing trunk AROM  Strength   Overall Strength Within functional limits for tasks performed  performed LE manual muscle testing in sitting   Ambulation/Gait   Ambulation/Gait Yes   Ambulation/Gait Assistance 6: Modified independent (Device/Increase time)  limited distance; has a cane if needed, not used all the tim   Balance   Balance Assessed Yes   Dynamic Standing Balance   Dynamic Standing - Comments reaches 11 inches forward      Patient also reports she had been going to water aerobics, but has stopped this since current episode of shortness of breath.                    PT Education - 07/30/14 1641    Education provided Yes   Education Details posture, breathing, walking, energy conservation   Person(s) Educated Patient   Methods Explanation;Demonstration;Handout   Comprehension Verbalized understanding               Lung Clinic Goals - 07/30/14 1646    Patient will be able to verbalize  understanding of the benefit of exercise to decrease fatigue.   Status Achieved   Patient will be able to verbalize the importance of posture.   Status Achieved   Patient will be able to demonstrate diaphragmatic breathing for improved lung function.   Status Achieved   Patient will be able to verbalize understanding of the role of physical therapy to prevent functional decline and who to contact if physical therapy is needed.   Status Achieved             Plan - 07/30/14 1642    Clinical Impression Statement Pt. with multiple complaints including intermittent leg weakness with risk for falls; also h/o back problems and unexplained tingling in hands; now also with SOB   Pt will benefit from skilled therapeutic intervention in order to improve on the following deficits Cardiopulmonary status limiting activity;Decreased strength   Rehab Potential Fair   PT Frequency One time visit   PT Treatment/Interventions Patient/family education   PT Next Visit Plan None at this time; pt. may benefit from therapy going forward for reported leg weakness and for endurance.   PT Home Exercise Plan See patient education section.   Consulted and Agree with Plan of Care Patient          G-Codes - 07/30/14 1647    Functional Assessment Tool Used clinical judgment   Functional Limitation Mobility: Walking and moving around   Mobility: Walking and Moving Around Current Status [phone]([id number]) At least 20 percent but less than 40 percent impaired, limited or restricted   Mobility: Walking and Moving Around Goal Status [phone](G8979) At least 20 percent but less than 40 percent impaired, limited or restricted       Problem List Patient Active Problem List   Diagnosis Date Noted  . Mediastinal lymphadenopathy 07/06/2014  . Pulmonary embolism 06/05/2014  . Hypertension 06/05/2014  . G E R D 10/27/2010    SALISBURY,DONNA 07/30/2014, 4:49 PM SALISBURY,DONNA, PT   [facility] [address] [place], [place], [address] Phone: [phone]   Fax:  [phone]

## 2014-07-30 NOTE — Progress Notes (Signed)
PCP is Beverley Fiedler, MD Referring Provider is Si Gaul, MD  No chief complaint on file.   HPI: 68 yo woman sent for evaluation for mediastinoscopy.  Melanie Pittman is a 68 yo woman with a PMH significant for pulmonary embolus, HTN, and sleep apnea. She was doing well until she had a dental procedure in September and had seizures, possibly due to a local anesthetic used. In October hse had sudden onset of shortness of breath and chest pain. A CT angio showed bilateral PEs. She was started on Eliquis. She was also noted to have enlarged mediastinal lymph nodes on the CT.  She saw Dr. Arbutus Ped and a PET/ CT was done. It showed bilateral hilar and mediastinal adenopathy that was hypermetabolic. There also was a small left supraclavicular node that was positive.  She has had shortness of breath with exertion since the PE. She also complains of a "pulling" sensation on the left side of her chest and left neck.  Zubrod Score: At the time of surgery this patient's most appropriate activity status/level should be described as:     0    Normal activity, no symptoms     1    Restricted in physical strenuous activity but ambulatory, able to do out light work     2    Ambulatory and capable of self care, unable to do work activities, up and about >50 % of waking hours                                  3    Only limited self care, in bed greater than 50% of waking hours     4    Completely disabled, no self care, confined to bed or chair     5    Moribund    Past Medical History  Diagnosis Date  . GERD (gastroesophageal reflux disease)   . Hypertension   . Arthritis   . PE (pulmonary embolism)   . Sciatica   . Vertigo   . Depression   . Anxiety   . Meniere disease   . OSA (obstructive sleep apnea)     Past Surgical History  Procedure Laterality Date  . Abdominal hysterectomy    . Knee surgery Right   . Abdominal hysterectomy    . Bowel resection    . Foot surgery     . Hand surgery      Family History  Problem Relation Age of Onset  . Colon polyps Father   . Arthritis Father   . Prostate cancer Father   . Heart disease Father   . Hyperlipidemia Father   . Hypertension Mother   . Cancer Mother     lung,breast  . Heart disease Sister   . Kidney disease Sister   . Cancer Sister     ovarian  . Kidney disease Son   . Diabetes Paternal Uncle   . Diabetes Cousin   . Stroke Other     Social History History  Substance Use Topics  . Smoking status: Never Smoker   . Smokeless tobacco: Never Used  . Alcohol Use: Yes     Comment: wine 1-2 weekly     Current Outpatient Prescriptions  Medication Sig Dispense Refill  . Coenzyme Q10 (CO Q 10 PO) Take 1 tablet by mouth daily.    Marland Kitchen esomeprazole (NEXIUM) 40 MG capsule Take 40 mg by mouth daily as needed.    Marland Kitchen  fish oil-omega-3 fatty acids 1000 MG capsule Take 1 g by mouth daily.     . Multiple Vitamin (MULTIVITAMIN WITH MINERALS) TABS Take 1 tablet by mouth daily.    Marland Kitchen apixaban (ELIQUIS) 5 MG TABS tablet Take 2 tablets (10 mg total) by mouth 2 (two) times daily. 72 tablet 0  . lisinopril-hydrochlorothiazide (PRINZIDE,ZESTORETIC) 20-12.5 MG per tablet Take 1 tablet by mouth daily.     No current facility-administered medications for this visit.    Allergies  Allergen Reactions  . Nutmeg Oil (Myristica Oil) Itching  . Hydrocodone Palpitations  . Penicillins Rash  . Prednisone Palpitations    Review of Systems  Constitutional: Positive for appetite change. Unexpected weight change: lost 12 pounds over 6 months, gained 3 pounds over last 3 months.  Respiratory: Positive for apnea (CPAP) and shortness of breath.   Cardiovascular: Positive for chest pain (tightness since PE, "pulling" in left chest and neck).  Gastrointestinal:       Reflux  Musculoskeletal: Positive for arthralgias (hip pain).       Leg cramps  Neurological: Positive for numbness.       Feels pressure posterior scalp several  times a day, "not a headache". Last 1-2 minutes  Hematological: Positive for adenopathy. Bruises/bleeds easily.  Psychiatric/Behavioral: Positive for dysphoric mood. The patient is nervous/anxious.   All other systems reviewed and are negative.   BP 143/66 mmHg  Pulse 105  Temp(Src) 97.6 F (36.4 C)  Resp 19  Ht 5' 6.5" (1.689 m)  Wt 221 lb 1.6 oz (100.29 kg)  BMI 35.16 kg/m2  SpO2 100% Physical Exam  Constitutional: No distress.  obese  HENT:  Head: Normocephalic and atraumatic.  Eyes: EOM are normal. Pupils are equal, round, and reactive to light.  Neck: Neck supple. No tracheal deviation present. No thyromegaly present.  Lymphadenopathy:    She has no cervical adenopathy.  Vitals reviewed.    Diagnostic Tests: NUCLEAR MEDICINE PET SKULL BASE TO THIGH  TECHNIQUE: 11.12 mCi F-18 FDG was injected intravenously. Full-ring PET imaging was performed from the skull base to thigh after the radiotracer. CT data was obtained and used for attenuation correction and anatomic localization.  FASTING BLOOD GLUCOSE: Value: 112 mg/dl  COMPARISON: Chest CT 16/05/9603. Lumbar MRI 06/22/2014.  FINDINGS: NECK  There is a single hypermetabolic left supraclavicular lymph node which measures 8 mm short axis on image 42 and has an SUV max of 5.1. No other hypermetabolic cervical lymph nodes are demonstrated.There are no lesions of the pharyngeal mucosal space.  CHEST  The enlarged mediastinal and hilar lymph nodes demonstrated on previous CT are hypermetabolic. 12 mm right paratracheal node on image 64 has an SUV max of 6.4. Right hilar nodes demonstrate an SUV max of 9.3 and left hilar nodes an SUV max of 8.2. 9 mm node posterior to the descending aorta on image 85 has an SUV max of 8.7. There is no hypermetabolic pulmonary activity or suspicious pulmonary nodule. Mild emphysematous changes are present. No specific pulmonary findings of sarcoidosis  identified.  ABDOMEN/PELVIS  Small portacaval nodes are mildly hypermetabolic (SUV max 4.8). No other hypermetabolic lymph nodes are identified within the abdomen or pelvis. There is no abnormal metabolic activity within the liver, spleen, adrenal glands or pancreas. Images through the pelvis demonstrate probable pelvic floor laxity status post hysterectomy.  SKELETON  There is no hypermetabolic activity to suggest osseous metastatic disease.  IMPRESSION: 1. Multifocal hypermetabolic adenopathy within the mediastinum and hilar stations, similar to chest CT  performed approximately 5 weeks ago. There is a single mildly hypermetabolic left supraclavicular node as well as mildly hypermetabolic lymph nodes in the portacaval space. These nodes are nonspecific and could indicate a lymphoproliferative or inflammatory process. Tissue sampling should be considered. If not performed, short-term follow-up imaging recommended to assess stability. 2. No hypermetabolic pulmonary lesions identified. 3. No suspicious extra nodal activity within the abdomen or pelvis.   Electronically Signed  By: Roxy HorsemanBill Veazey M.D.  On: 07/14/2014 13:44  CT ANGIOGRAPHY CHEST WITH CONTRAST  TECHNIQUE: Multidetector CT imaging of the chest was performed using the standard protocol during bolus administration of intravenous contrast. Multiplanar CT image reconstructions and MIPs were obtained to evaluate the vascular anatomy.  CONTRAST: 100mL OMNIPAQUE IOHEXOL 350 MG/ML SOLN  COMPARISON: No priors.  FINDINGS: Mediastinum: Large volume of clot in the distal main pulmonary arteries bilaterally extending into lobar, segmental and subsegmental sized pulmonary artery branches bilaterally. No central saddle embolus is noted. Pulmonic trunk is mildly dilated measuring up to 3.1 cm. Right ventricle appears mildly dilated right ventricle is slightly prominent measuring up to 4.4 cm in diameter.  Left ventricular diameter is up to 4.3 cm on today's non gated CT examination. RV to LV ratio of 1.02. No pericardial fluid, thickening or pericardial calcification. Numerous enlarged and borderline enlarged mediastinal and bilateral hilar lymph nodes. Specific examples include AP window lymph node measuring 1 cm, low right paratracheal lymph node measuring 13 mm, left hilar lymph node measuring 11 mm, and a subcarinal lymph node measuring 11 mm. Small hiatal hernia.  Lungs/Pleura: In the perihilar aspect of the right lung intimately associated with the major fissure (image 48 of series 4) there is a smoothly marginated 12 x 21 mm nodular lesion, which the other represent a nodule in the right middle or lower lobe, or could simply represent an enlarged interlobar lymph node. No other suspicious appearing pulmonary nodules or masses are noted. No acute consolidative airspace disease. No pleural effusions.  Upper Abdomen: Unremarkable.  Musculoskeletal: There are no aggressive appearing lytic or blastic lesions noted in the visualized portions of the skeleton.  Review of the MIP images confirms the above findings.  IMPRESSION: 1. Positive for acute PE with CT evidence of right heartstrain (RV/LV Ratio = 1.02) consistent with at least submassive (intermediate risk) PE. The presence of right heart strain has been associated with anincreased risk of morbidity and mortality. Consultation with Pulmonary and Critical Care Medicine is recommended. 2. The patient has extensive mediastinal and bilateral hilar lymphadenopathy. In addition, there is a nodular opacity in the perihilar aspect of the right lung measuring 12 x 21 mm. It is uncertain whether or not this represents a pulmonary nodule, or is simply an enlarged interlobar lymph node (which is favored). The overall appearance is favored to reflect a lymphoproliferative disorder, and clinical correlation is recommended. Follow-up  imaging is suggested to ensure resolution of these findings after appropriate medical therapy. Critical Value/emergent results were called by telephone at the time of interpretation on 06/05/2014 at 7:30 pm to Dr. Pricilla LovelessSCOTT GOLDSTON, who verbally acknowledged these results.   Electronically Signed  By: Trudie Reedaniel Entrikin M.D.  On: 06/05/2014 19:30   Impression: 68 yo woman with a recent PE (< 3 months) who has been found to have mediastinal and bilateral hilar adenopathy. These nodes are hypermetabolic by PET and are suspicious for malignancy, specifically lymphoma. There is a small supraclavicular node visible on PET but it is not palpable on exam.  Dr. Arbutus PedMohamed needs a  tissue diagnosis to direct therapy.  I discussed the proposed procedure of mediastinoscopy with Ms. Engdahl.   We discussed the general nature of the procedure, the need for general anesthesia, and the incision to be used. She understands we would do this on an outpatient basis. I informed her of the indications, risks,benefits and alternatives. She understands the risks include, but are not limited to death, stroke, MI, DVT/PE, bleeding, possible need for transfusion, infections, pneumothorax, recurrent nerve injury, as well as the possibility of unforeseeable complications. She accepts the risks and agre to proceed.  We will plan to stop her Eliquis 24 hours prior to the procedure and resume the following day. Given the short time interval bridging should not be necessary.  Plan: Mediastinoscopy Monday 08/17/2014

## 2014-07-30 NOTE — Telephone Encounter (Signed)
Called to check on patient due to being late for appt.  She stated she is 5 minutes away and is coming

## 2014-07-31 ENCOUNTER — Encounter: Payer: Self-pay | Admitting: *Deleted

## 2014-07-31 NOTE — Progress Notes (Signed)
[facility] Psychosocial Distress Screening Clinical Social Work  Clinical Social Work was referred by distress screening protocol.  The patient scored a 5 on the Psychosocial Distress Thermometer which indicates moderate distress. Clinical Social Worker phoned pt to assess for distress and other psychosocial needs. Pt's housing concerns are related to her son wanting her to move to be closer to him. She is also having financial concerns, but appears to have income that would not qualify her for assistance. CSW processed emotions and reviewed resources of [facility] and Patient and Family Support Team with her. Pt aware and agrees to reach out as needed.   ONCBCN DISTRESS SCREENING 07/21/2014  Distress experienced in past week (1-10) 5  Practical problem type Housing  Emotional problem type Nervousness/Anxiety  Physical Problem type Constipation/diarrhea;Other (comment)  Physician notified of physical symptoms Yes  Referral to clinical psychology No  Referral to clinical social work Yes  Referral to dietition No  Referral to financial advocate No  Referral to support programs No  Referral to palliative care No    Clinical Social Worker follow up needed: No.  If yes, follow up plan:  Doreen SalvageGrier Numair Masden, LCSW Clinical Social Worker [facility]  [facility] Phone: [phone](336) [phone] Fax: [phone]) [phone]

## 2014-08-03 ENCOUNTER — Encounter: Payer: Self-pay | Admitting: Neurology

## 2014-08-05 ENCOUNTER — Encounter (HOSPITAL_COMMUNITY): Payer: Self-pay

## 2014-08-05 ENCOUNTER — Ambulatory Visit (HOSPITAL_BASED_OUTPATIENT_CLINIC_OR_DEPARTMENT_OTHER): Payer: Medicare PPO | Admitting: Internal Medicine

## 2014-08-05 ENCOUNTER — Telehealth: Payer: Self-pay | Admitting: Internal Medicine

## 2014-08-05 ENCOUNTER — Encounter: Payer: Self-pay | Admitting: Internal Medicine

## 2014-08-05 ENCOUNTER — Encounter (HOSPITAL_COMMUNITY)
Admission: RE | Admit: 2014-08-05 | Discharge: 2014-08-05 | Disposition: A | Discharge status: Home / Self Care | Payer: Medicare PPO | Source: Ambulatory Visit | Attending: Thoracic Surgery (Cardiothoracic Vascular Surgery) | Admitting: Thoracic Surgery (Cardiothoracic Vascular Surgery)

## 2014-08-05 ENCOUNTER — Ambulatory Visit (HOSPITAL_BASED_OUTPATIENT_CLINIC_OR_DEPARTMENT_OTHER): Payer: Medicare PPO | Admitting: Lab

## 2014-08-05 VITALS — BP 109/52 | HR 80 | Temp 98.2°F | Resp 18 | Ht 66.5 in | Wt 221.3 lb

## 2014-08-05 VITALS — BP 108/52 | HR 79 | Temp 97.7°F | Resp 18 | Ht 66.5 in | Wt 220.0 lb

## 2014-08-05 DIAGNOSIS — R590 Localized enlarged lymph nodes: Secondary | ICD-10-CM

## 2014-08-05 DIAGNOSIS — I452 Bifascicular block: Secondary | ICD-10-CM | POA: Diagnosis not present

## 2014-08-05 DIAGNOSIS — Z01818 Encounter for other preprocedural examination: Secondary | ICD-10-CM | POA: Insufficient documentation

## 2014-08-05 DIAGNOSIS — I444 Left anterior fascicular block: Secondary | ICD-10-CM | POA: Insufficient documentation

## 2014-08-05 DIAGNOSIS — Z23 Encounter for immunization: Secondary | ICD-10-CM

## 2014-08-05 DIAGNOSIS — R599 Enlarged lymph nodes, unspecified: Secondary | ICD-10-CM

## 2014-08-05 DIAGNOSIS — I4510 Unspecified right bundle-branch block: Secondary | ICD-10-CM | POA: Diagnosis not present

## 2014-08-05 HISTORY — DX: Adverse effect of unspecified anesthetic, initial encounter: T41.45XA

## 2014-08-05 HISTORY — DX: Disease of blood and blood-forming organs, unspecified: D75.9

## 2014-08-05 HISTORY — DX: Reserved for inherently not codable concepts without codable children: IMO0001

## 2014-08-05 HISTORY — DX: Other complications of anesthesia, initial encounter: T88.59XA

## 2014-08-05 LAB — CBC WITH DIFFERENTIAL/PLATELET
BASO%: 0.4 % (ref 0.0–2.0)
Basophils Absolute: 0 10*3/uL (ref 0.0–0.1)
EOS%: 3.7 % (ref 0.0–7.0)
Eosinophils Absolute: 0.2 10*3/uL (ref 0.0–0.5)
HCT: 36.6 % (ref 34.8–46.6)
HGB: 12.3 g/dL (ref 11.6–15.9)
LYMPH%: 25.3 % (ref 14.0–49.7)
MCH: 30.7 pg (ref 25.1–34.0)
MCHC: 33.6 g/dL (ref 31.5–36.0)
MCV: 91.3 fL (ref 79.5–101.0)
MONO#: 0.4 10*3/uL (ref 0.1–0.9)
MONO%: 7.8 % (ref 0.0–14.0)
NEUT#: 3.2 10*3/uL (ref 1.5–6.5)
NEUT%: 62.8 % (ref 38.4–76.8)
PLATELETS: 261 10*3/uL (ref 145–400)
RBC: 4.01 10*6/uL (ref 3.70–5.45)
RDW: 12.7 % (ref 11.2–14.5)
WBC: 5.1 10*3/uL (ref 3.9–10.3)
lymph#: 1.3 10*3/uL (ref 0.9–3.3)

## 2014-08-05 LAB — COMPREHENSIVE METABOLIC PANEL (CC13)
ALT: 22 U/L (ref 0–55)
ANION GAP: 8 meq/L (ref 3–11)
AST: 22 U/L (ref 5–34)
Albumin: 3.7 g/dL (ref 3.5–5.0)
Alkaline Phosphatase: 90 U/L (ref 40–150)
BILIRUBIN TOTAL: 0.24 mg/dL (ref 0.20–1.20)
BUN: 15.1 mg/dL (ref 7.0–26.0)
CO2: 27 mEq/L (ref 22–29)
CREATININE: 0.7 mg/dL (ref 0.6–1.1)
Calcium: 9.7 mg/dL (ref 8.4–10.4)
Chloride: 105 mEq/L (ref 98–109)
EGFR: >90 mL/min/{1.73_m2} (ref >90)
Glucose: 101 mg/dl (ref 70–140)
Potassium: 4.1 mEq/L (ref 3.5–5.1)
Sodium: 140 mEq/L (ref 136–145)
Total Protein: 7.3 g/dL (ref 6.4–8.3)

## 2014-08-05 LAB — COMPREHENSIVE METABOLIC PANEL
ALT: 22 U/L (ref 0–35)
AST: 25 U/L (ref 0–37)
Albumin: 4 g/dL (ref 3.5–5.2)
Alkaline Phosphatase: 83 U/L (ref 39–117)
Anion gap: 8 (ref 5–15)
BUN: 12 mg/dL (ref 6–23)
CALCIUM: 9.6 mg/dL (ref 8.4–10.5)
CHLORIDE: 105 meq/L (ref 96–112)
CO2: 24 mmol/L (ref 19–32)
CREATININE: 0.56 mg/dL (ref 0.50–1.10)
GFR calc Af Amer: >90 mL/min (ref >90)
GFR calc non Af Amer: >90 mL/min (ref >90)
Glucose, Bld: 96 mg/dL (ref 70–99)
Potassium: 3.7 mmol/L (ref 3.5–5.1)
SODIUM: 137 mmol/L (ref 135–145)
Total Bilirubin: 0.4 mg/dL (ref 0.3–1.2)
Total Protein: 7.3 g/dL (ref 6.0–8.3)

## 2014-08-05 LAB — CBC
HCT: 37.4 % (ref 36.0–46.0)
Hemoglobin: 12.6 g/dL (ref 12.0–15.0)
MCH: 30.5 pg (ref 26.0–34.0)
MCHC: 33.7 g/dL (ref 30.0–36.0)
MCV: 90.6 fL (ref 78.0–100.0)
PLATELETS: 255 10*3/uL (ref 150–400)
RBC: 4.13 MIL/uL (ref 3.87–5.11)
RDW: 12.6 % (ref 11.5–15.5)
WBC: 4.8 10*3/uL (ref 4.0–10.5)

## 2014-08-05 LAB — PROTIME-INR
INR: 1.08 (ref 0.00–1.49)
PROTHROMBIN TIME: 14.2 s (ref 11.6–15.2)

## 2014-08-05 LAB — SURGICAL PCR SCREEN
MRSA, PCR: NEGATIVE
Staphylococcus aureus: NEGATIVE

## 2014-08-05 LAB — TYPE AND SCREEN
ABO/RH(D): A POS
ANTIBODY SCREEN: NEGATIVE

## 2014-08-05 LAB — LACTATE DEHYDROGENASE (CC13): LDH: 165 U/L (ref 125–245)

## 2014-08-05 LAB — APTT: aPTT: 33 seconds (ref 24–37)

## 2014-08-05 LAB — ABO/RH: ABO/RH(D): A POS

## 2014-08-05 NOTE — Progress Notes (Signed)
[facility] Telephone:(336) [phone]   Fax:(336) [phone]  OFFICE PROGRESS NOTE  Beverley FiedlerANKINS,VICTORIA, MD [address] [place] [id number]  DIAGNOSIS: Mediastinal lymphadenopathy concerning for lymphoproliferative disorder.  PRIOR THERAPY: None   CURRENT THERAPY: None  INTERVAL HISTORY: Melanie Pittman 68 y.o. female returns to the clinic today for follow-up visit. The patient is feeling fine today was no specific complaints except for aching pain in the back and hip area. She also had some recent neck pain and numbness in her tongue which lasted for short period and resolved spontaneously. She denied having any significant chest pain or hemoptysis. The patient denied having any significant weight loss or night sweats. She has no nausea or vomiting, no fever or chills. She was seen recently by Dr. Dorris FetchHendrickson and discussion for excisional biopsy of one of the enlarged lymph nodes on 08/17/2014.   MEDICAL HISTORY: Past Medical History  Diagnosis Date  . GERD (gastroesophageal reflux disease)   . Hypertension   . Arthritis   . PE (pulmonary embolism)   . Sciatica   . Vertigo   . Depression   . Anxiety   . Meniere disease   . OSA (obstructive sleep apnea)     ALLERGIES:  is allergic to nutmeg oil (myristica oil); hydrocodone; penicillins; and prednisone.  MEDICATIONS:  Current Outpatient Prescriptions  Medication Sig Dispense Refill  . apixaban (ELIQUIS) 5 MG TABS tablet Take 2 tablets (10 mg total) by mouth 2 (two) times daily. 72 tablet 0  . B Complex-Biotin-FA (B COMPLETE PO) Take 1 tablet by mouth daily.    . Coenzyme Q10 (CO Q 10 PO) Take 1 tablet by mouth daily.    Marland Kitchen. esomeprazole (NEXIUM) 40 MG capsule Take 40 mg by mouth daily as needed.    . fish oil-omega-3 fatty acids 1000 MG capsule Take 1 g by mouth daily.     Marland Kitchen. lisinopril-hydrochlorothiazide (PRINZIDE,ZESTORETIC) 20-12.5 MG per tablet Take 1 tablet by mouth daily.    . Multiple Vitamin  (MULTIVITAMIN WITH MINERALS) TABS Take 1 tablet by mouth daily.     No current facility-administered medications for this visit.    SURGICAL HISTORY:  Past Surgical History  Procedure Laterality Date  . Abdominal hysterectomy    . Knee surgery Right   . Abdominal hysterectomy    . Bowel resection    . Foot surgery    . Hand surgery      REVIEW OF SYSTEMS:  A comprehensive review of systems was negative except for: Constitutional: positive for fatigue Musculoskeletal: positive for arthralgias   PHYSICAL EXAMINATION: General appearance: alert, cooperative, fatigued and no distress Head: Normocephalic, without obvious abnormality, atraumatic Neck: no adenopathy, no JVD, supple, symmetrical, trachea midline and thyroid not enlarged, symmetric, no tenderness/mass/nodules Lymph nodes: Cervical, supraclavicular, and axillary nodes normal. Resp: clear to auscultation bilaterally Back: symmetric, no curvature. ROM normal. No CVA tenderness. Cardio: regular rate and rhythm, S1, S2 normal, no murmur, click, rub or gallop GI: soft, non-tender; bowel sounds normal; no masses,  no organomegaly Extremities: extremities normal, atraumatic, no cyanosis or edema  ECOG PERFORMANCE STATUS: 1 - Symptomatic but completely ambulatory  Blood pressure 109/52, pulse 80, temperature 98.2 F (36.8 C), temperature source Oral, resp. rate 18, height 5' 6.5" (1.689 m), weight 221 lb 4.8 oz (100.381 kg), SpO2 100 %.  LABORATORY DATA: Lab Results  Component Value Date   WBC 5.1 08/05/2014   HGB 12.3 08/05/2014   HCT 36.6 08/05/2014   MCV  91.3 08/05/2014   PLT 261 08/05/2014      Chemistry      Component Value Date/Time   NA 140 07/06/2014 1421   NA 137 06/09/2014 1707   K 4.1 07/06/2014 1421   K 4.0 06/09/2014 1707   CL 99 06/09/2014 1707   CO2 24 07/06/2014 1421   CO2 22 06/09/2014 1707   BUN 12.9 07/06/2014 1421   BUN 13 06/09/2014 1707   CREATININE 0.7 07/06/2014 1421   CREATININE 0.60  06/09/2014 1707      Component Value Date/Time   CALCIUM 9.4 07/06/2014 1421   CALCIUM 10.0 06/09/2014 1707   ALKPHOS 90 07/06/2014 1421   ALKPHOS 102 06/09/2014 1707   AST 20 07/06/2014 1421   AST 120* 06/09/2014 1707   ALT 21 07/06/2014 1421   ALT 90* 06/09/2014 1707   BILITOT 0.37 07/06/2014 1421   BILITOT 0.4 06/09/2014 1707       RADIOGRAPHIC STUDIES: Nm Pet Image Initial (pi) Skull Base To Thigh  07/14/2014   CLINICAL DATA:  Initial treatment strategy for mediastinal lymphadenopathy. No given history of malignancy. Initial encounter.  EXAM: NUCLEAR MEDICINE PET SKULL BASE TO THIGH  TECHNIQUE: 11.12 mCi F-18 FDG was injected intravenously. Full-ring PET imaging was performed from the skull base to thigh after the radiotracer. CT data was obtained and used for attenuation correction and anatomic localization.  FASTING BLOOD GLUCOSE:  Value: 112 mg/dl  COMPARISON:  Chest CT 06/05/2014.  Lumbar MRI 06/22/2014.  FINDINGS: NECK  There is a single hypermetabolic left supraclavicular lymph node which measures 8 mm short axis on image 42 and has an SUV max of 5.1. No other hypermetabolic cervical lymph nodes are demonstrated.There are no lesions of the pharyngeal mucosal space.  CHEST  The enlarged mediastinal and hilar lymph nodes demonstrated on previous CT are hypermetabolic. 12 mm right paratracheal node on image 64 has an SUV max of 6.4. Right hilar nodes demonstrate an SUV max of 9.3 and left hilar nodes an SUV max of 8.2. 9 mm node posterior to the descending aorta on image 85 has an SUV max of 8.7. There is no hypermetabolic pulmonary activity or suspicious pulmonary nodule. Mild emphysematous changes are present. No specific pulmonary findings of sarcoidosis identified.  ABDOMEN/PELVIS  Small portacaval nodes are mildly hypermetabolic (SUV max 4.8). No other hypermetabolic lymph nodes are identified within the abdomen or pelvis. There is no abnormal metabolic activity within the liver,  spleen, adrenal glands or pancreas. Images through the pelvis demonstrate probable pelvic floor laxity status post hysterectomy.  SKELETON  There is no hypermetabolic activity to suggest osseous metastatic disease.  IMPRESSION: 1. Multifocal hypermetabolic adenopathy within the mediastinum and hilar stations, similar to chest CT performed approximately 5 weeks ago. There is a single mildly hypermetabolic left supraclavicular node as well as mildly hypermetabolic lymph nodes in the portacaval space. These nodes are nonspecific and could indicate a lymphoproliferative or inflammatory process. Tissue sampling should be considered. If not performed, short-term follow-up imaging recommended to assess stability. 2. No hypermetabolic pulmonary lesions identified. 3. No suspicious extra nodal activity within the abdomen or pelvis.   Electronically Signed   By: Roxy HorsemanBill  Veazey M.D.   On: 07/14/2014 13:44    ASSESSMENT AND PLAN: This is a very pleasant 68 years old white female who presented with hypermetabolic mediastinal lymphadenopathy suspicious for myeloproliferative disorder. The patient was seen recently by Dr. Dorris FetchHendrickson and he is considering her for excisional lymph node biopsy user from the left  supraclavicular node or mediastinal lymph node. This is scheduled to be performed on 08/17/2014. I will see her back for follow-up visit in 3 weeks for reevaluation and discussion of her biopsy results and recommendation regarding treatment of her condition. She was advised to call immediately if she has any concerning symptoms in the interval. The patient voices understanding of current disease status and treatment options and is in agreement with the current care plan.  All questions were answered. The patient knows to call the clinic with any problems, questions or concerns. We can certainly see the patient much sooner if necessary.  Disclaimer: This note was dictated with voice recognition software. Similar  sounding words can inadvertently be transcribed and may not be corrected upon review.

## 2014-08-05 NOTE — Telephone Encounter (Signed)
Gave avs & cal for Jan. °

## 2014-08-05 NOTE — Pre-Procedure Instructions (Signed)
Melanie Pittman  08/05/2014   Your procedure is scheduled on:  Monday, January 4th  [facility] at 645 AM.  Call this number if you have problems the morning of surgery: [phone]   Remember:   Do not eat food or drink liquids after midnight.   Take these medicines the morning of surgery with A SIP OF WATER: nexium  Stop eliquis as instructed by your surgeon   Do not wear jewelry, make-up or nail polish.  Do not wear lotions, powders, or perfumes, deodorant.  Do not shave 48 hours prior to surgery. Men may shave face and neck.  Do not bring valuables to the hospital.  [facility] is not responsible  for any belongings or valuables.               Contacts, dentures or bridgework may not be worn into surgery.  Leave suitcase in the car. After surgery it may be brought to your room.  For patients admitted to the hospital, discharge time is determined by your treatment team.               Patients discharged the day of surgery will not be allowed to drive home.  Please read over the following fact sheets that you were given: Pain Booklet, Coughing and Deep Breathing, Blood Transfusion Information, MRSA Information and Surgical Site Infection Prevention  [place] - Preparing for Surgery  Before surgery, you can play an important role.  Because skin is not sterile, your skin needs to be as free of germs as possible.  You can reduce the number of germs on you skin by washing with CHG (chlorahexidine gluconate) soap before surgery.  CHG is an antiseptic cleaner which kills germs and bonds with the skin to continue killing germs even after washing.  Please DO NOT use if you have an allergy to CHG or antibacterial soaps.  If your skin becomes reddened/irritated stop using the CHG and inform your nurse when you arrive at Short Stay.  Do not shave (including legs and underarms) for at least 48 hours prior to the first CHG shower.  You may shave your  face.  Please follow these instructions carefully:   1.  Shower with CHG Soap the night before surgery and the morning of Surgery.  2.  If you choose to wash your hair, wash your hair first as usual with your normal shampoo.  3.  After you shampoo, rinse your hair and body thoroughly to remove the shampoo.  4.  Use CHG as you would any other liquid soap.  You can apply CHG directly to the skin and wash gently with scrungie or a clean washcloth.  5.  Apply the CHG Soap to your body ONLY FROM THE NECK DOWN.  Do not use on open wounds or open sores.  Avoid contact with your eyes, ears, mouth and genitals (private parts).  Wash genitals (private parts) with your normal soap.  6.  Wash thoroughly, paying special attention to the area where your surgery will be performed.  7.  Thoroughly rinse your body with warm water from the neck down.  8.  DO NOT shower/wash with your normal soap after using and rinsing off the CHG Soap.  9.  Pat yourself dry with a clean towel.            10.  Wear clean pajamas.            11.  Place clean sheets  on your bed the night of your first shower and do not sleep with pets.  Day of Surgery  Do not apply any lotions/deoderants the morning of surgery.  Please wear clean clothes to the hospital/surgery center.

## 2014-08-06 NOTE — Consult Note (Signed)
Anesthesiology Chart Review:  68 year old female for mediastinoscopy 08/17/14 by Dorris FetchHendrickson. Patient has a H/O htn, sleep apnea and pulmonary embolus 06/05/14. CT angio of chest showed bilateral mediastinal adenopathy which was found to be hypermetabolic on PET scan. She also had severe anxiety and a possible seizure  during a tooth extraction  04/13/14. Seen in ER, discharged home.  Now on eliquis with instructions to hold it for 24 hours prior to surgery. Labs OK.  She will be evaluated by her assigned anesthesiologist prior to surgery on 08/17/14.

## 2014-08-13 NOTE — Progress Notes (Signed)
Notified to arrive at 530 for surgery

## 2014-08-16 MED ORDER — VANCOMYCIN HCL 10 G IV SOLR
1500.0000 mg | INTRAVENOUS | Status: AC
  Administered 2014-08-17: 1500 mg via INTRAVENOUS
  Filled 2014-08-16: qty 1500

## 2014-08-17 ENCOUNTER — Ambulatory Visit (HOSPITAL_COMMUNITY)
Admission: RE | Admit: 2014-08-17 | Discharge: 2014-08-17 | Disposition: A | Discharge status: Home / Self Care | Payer: Medicare PPO | Source: Ambulatory Visit | Attending: Thoracic Surgery (Cardiothoracic Vascular Surgery) | Admitting: Thoracic Surgery (Cardiothoracic Vascular Surgery)

## 2014-08-17 ENCOUNTER — Ambulatory Visit (HOSPITAL_COMMUNITY): Payer: Medicare PPO | Admitting: Anesthesiology

## 2014-08-17 ENCOUNTER — Encounter (HOSPITAL_COMMUNITY)
Admission: RE | Disposition: A | Discharge status: Home / Self Care | Payer: Self-pay | Source: Ambulatory Visit | Attending: Thoracic Surgery (Cardiothoracic Vascular Surgery)

## 2014-08-17 ENCOUNTER — Encounter (HOSPITAL_COMMUNITY): Payer: Self-pay | Admitting: *Deleted

## 2014-08-17 ENCOUNTER — Ambulatory Visit (HOSPITAL_COMMUNITY): Payer: Medicare PPO

## 2014-08-17 DIAGNOSIS — K219 Gastro-esophageal reflux disease without esophagitis: Secondary | ICD-10-CM | POA: Insufficient documentation

## 2014-08-17 DIAGNOSIS — G4733 Obstructive sleep apnea (adult) (pediatric): Secondary | ICD-10-CM | POA: Insufficient documentation

## 2014-08-17 DIAGNOSIS — K449 Diaphragmatic hernia without obstruction or gangrene: Secondary | ICD-10-CM | POA: Diagnosis not present

## 2014-08-17 DIAGNOSIS — R590 Localized enlarged lymph nodes: Secondary | ICD-10-CM | POA: Diagnosis present

## 2014-08-17 DIAGNOSIS — M1990 Unspecified osteoarthritis, unspecified site: Secondary | ICD-10-CM | POA: Insufficient documentation

## 2014-08-17 DIAGNOSIS — I10 Essential (primary) hypertension: Secondary | ICD-10-CM | POA: Insufficient documentation

## 2014-08-17 DIAGNOSIS — Z79899 Other long term (current) drug therapy: Secondary | ICD-10-CM | POA: Diagnosis not present

## 2014-08-17 DIAGNOSIS — Z883 Allergy status to other anti-infective agents status: Secondary | ICD-10-CM | POA: Insufficient documentation

## 2014-08-17 DIAGNOSIS — Z86711 Personal history of pulmonary embolism: Secondary | ICD-10-CM | POA: Insufficient documentation

## 2014-08-17 DIAGNOSIS — F418 Other specified anxiety disorders: Secondary | ICD-10-CM | POA: Diagnosis not present

## 2014-08-17 DIAGNOSIS — Z880 Allergy status to penicillin: Secondary | ICD-10-CM | POA: Insufficient documentation

## 2014-08-17 DIAGNOSIS — Z888 Allergy status to other drugs, medicaments and biological substances status: Secondary | ICD-10-CM | POA: Diagnosis not present

## 2014-08-17 HISTORY — PX: MEDIASTINOSCOPY: SHX5086

## 2014-08-17 SURGERY — MEDIASTINOSCOPY
Anesthesia: General | Site: Chest

## 2014-08-17 MED ORDER — CHLORHEXIDINE GLUCONATE 4 % EX LIQD
60.0000 mL | Freq: Once | CUTANEOUS | Status: DC
  Filled 2014-08-17: qty 60

## 2014-08-17 MED ORDER — FENTANYL CITRATE 0.05 MG/ML IJ SOLN
INTRAMUSCULAR | Status: DC | PRN
  Administered 2014-08-17: 50 ug via INTRAVENOUS
  Administered 2014-08-17: 25 ug via INTRAVENOUS
  Administered 2014-08-17: 50 ug via INTRAVENOUS
  Administered 2014-08-17: 75 ug via INTRAVENOUS

## 2014-08-17 MED ORDER — MIDAZOLAM HCL 2 MG/2ML IJ SOLN
INTRAMUSCULAR | Status: AC
  Filled 2014-08-17: qty 2

## 2014-08-17 MED ORDER — FENTANYL CITRATE 0.05 MG/ML IJ SOLN
INTRAMUSCULAR | Status: AC
  Filled 2014-08-17: qty 5

## 2014-08-17 MED ORDER — PHENYLEPHRINE HCL 10 MG/ML IJ SOLN
INTRAMUSCULAR | Status: DC | PRN
  Administered 2014-08-17 (×5): 40 ug via INTRAVENOUS

## 2014-08-17 MED ORDER — ROCURONIUM BROMIDE 50 MG/5ML IV SOLN
INTRAVENOUS | Status: AC
  Filled 2014-08-17: qty 1

## 2014-08-17 MED ORDER — ONDANSETRON HCL 4 MG/2ML IJ SOLN
INTRAMUSCULAR | Status: AC
  Filled 2014-08-17: qty 2

## 2014-08-17 MED ORDER — ARTIFICIAL TEARS OP OINT
TOPICAL_OINTMENT | OPHTHALMIC | Status: AC
  Filled 2014-08-17: qty 3.5

## 2014-08-17 MED ORDER — TRAMADOL HCL 50 MG PO TABS: 50.0000 mg | ORAL_TABLET | Freq: Four times a day (QID) | ORAL | Status: DC | PRN

## 2014-08-17 MED ORDER — HYDROMORPHONE HCL 1 MG/ML IJ SOLN
0.2500 mg | INTRAMUSCULAR | Status: DC | PRN
  Administered 2014-08-17: 0.5 mg via INTRAVENOUS

## 2014-08-17 MED ORDER — SUCCINYLCHOLINE CHLORIDE 20 MG/ML IJ SOLN
INTRAMUSCULAR | Status: DC | PRN
  Administered 2014-08-17: 100 mg via INTRAVENOUS

## 2014-08-17 MED ORDER — HYDROMORPHONE HCL 1 MG/ML IJ SOLN
INTRAMUSCULAR | Status: AC
  Filled 2014-08-17: qty 1

## 2014-08-17 MED ORDER — ARTIFICIAL TEARS OP OINT
TOPICAL_OINTMENT | OPHTHALMIC | Status: DC | PRN
  Administered 2014-08-17: 1 via OPHTHALMIC

## 2014-08-17 MED ORDER — PROPOFOL 10 MG/ML IV BOLUS
INTRAVENOUS | Status: AC
  Filled 2014-08-17: qty 20

## 2014-08-17 MED ORDER — ONDANSETRON HCL 4 MG/2ML IJ SOLN
INTRAMUSCULAR | Status: DC | PRN
  Administered 2014-08-17: 4 mg via INTRAVENOUS

## 2014-08-17 MED ORDER — PROPOFOL 10 MG/ML IV BOLUS
INTRAVENOUS | Status: DC | PRN
  Administered 2014-08-17: 150 mg via INTRAVENOUS
  Administered 2014-08-17: 30 mg via INTRAVENOUS

## 2014-08-17 MED ORDER — LIDOCAINE HCL 4 % MT SOLN
OROMUCOSAL | Status: DC | PRN
  Administered 2014-08-17: 4 mL via TOPICAL

## 2014-08-17 MED ORDER — NEOSTIGMINE METHYLSULFATE 10 MG/10ML IV SOLN
INTRAVENOUS | Status: AC
  Filled 2014-08-17: qty 1

## 2014-08-17 MED ORDER — GLYCOPYRROLATE 0.2 MG/ML IJ SOLN
INTRAMUSCULAR | Status: DC | PRN
  Administered 2014-08-17: 0.6 mg via INTRAVENOUS

## 2014-08-17 MED ORDER — APIXABAN 5 MG PO TABS: 5.0000 mg | ORAL_TABLET | Freq: Two times a day (BID) | ORAL | Status: DC

## 2014-08-17 MED ORDER — LACTATED RINGERS IV SOLN
INTRAVENOUS | Status: DC | PRN
  Administered 2014-08-17: 08:00:00 via INTRAVENOUS

## 2014-08-17 MED ORDER — 0.9 % SODIUM CHLORIDE (POUR BTL) OPTIME
TOPICAL | Status: DC | PRN
  Administered 2014-08-17: 1000 mL

## 2014-08-17 MED ORDER — LIDOCAINE HCL (CARDIAC) 20 MG/ML IV SOLN
INTRAVENOUS | Status: DC | PRN
  Administered 2014-08-17: 60 mg via INTRAVENOUS

## 2014-08-17 MED ORDER — NEOSTIGMINE METHYLSULFATE 10 MG/10ML IV SOLN
INTRAVENOUS | Status: DC | PRN
  Administered 2014-08-17: 4 mg via INTRAVENOUS

## 2014-08-17 MED ORDER — ROCURONIUM BROMIDE 100 MG/10ML IV SOLN
INTRAVENOUS | Status: DC | PRN
  Administered 2014-08-17: 10 mg via INTRAVENOUS
  Administered 2014-08-17: 20 mg via INTRAVENOUS

## 2014-08-17 MED ORDER — GLYCOPYRROLATE 0.2 MG/ML IJ SOLN
INTRAMUSCULAR | Status: AC
  Filled 2014-08-17: qty 3

## 2014-08-17 MED ORDER — ONDANSETRON HCL 4 MG/2ML IJ SOLN
4.0000 mg | Freq: Once | INTRAMUSCULAR | Status: AC | PRN
  Administered 2014-08-17: 4 mg via INTRAVENOUS

## 2014-08-17 MED ORDER — LIDOCAINE HCL (CARDIAC) 20 MG/ML IV SOLN
INTRAVENOUS | Status: AC
  Filled 2014-08-17: qty 5

## 2014-08-17 SURGICAL SUPPLY — 45 items
APPLIER CLIP LOGIC TI 5 (MISCELLANEOUS) IMPLANT
BLADE SURG 15 STRL LF DISP TIS (BLADE) IMPLANT
BLADE SURG 15 STRL SS (BLADE)
CANISTER SUCTION 2500CC (MISCELLANEOUS) ×2 IMPLANT
CLIP TI MEDIUM 6 (CLIP) IMPLANT
CONT SPEC 4OZ CLIKSEAL STRL BL (MISCELLANEOUS) ×10 IMPLANT
COVER SURGICAL LIGHT HANDLE (MISCELLANEOUS) ×2 IMPLANT
DERMABOND ADVANCED (GAUZE/BANDAGES/DRESSINGS)
DERMABOND ADVANCED .7 DNX12 (GAUZE/BANDAGES/DRESSINGS) IMPLANT
DRAPE CHEST BREAST 15X10 FENES (DRAPES) ×2 IMPLANT
ELECT CAUTERY BLADE 6.4 (BLADE) ×2 IMPLANT
ELECT REM PT RETURN 9FT ADLT (ELECTROSURGICAL) ×2
ELECTRODE REM PT RTRN 9FT ADLT (ELECTROSURGICAL) ×1 IMPLANT
GAUZE SPONGE 4X4 12PLY STRL (GAUZE/BANDAGES/DRESSINGS) ×2 IMPLANT
GAUZE SPONGE 4X4 16PLY XRAY LF (GAUZE/BANDAGES/DRESSINGS) ×2 IMPLANT
GLOVE SURG SIGNA 7.5 PF LTX (GLOVE) ×2 IMPLANT
GLOVE SURG SS PI 7.0 STRL IVOR (GLOVE) ×2 IMPLANT
GOWN STRL REUS W/ TWL LRG LVL3 (GOWN DISPOSABLE) ×1 IMPLANT
GOWN STRL REUS W/ TWL XL LVL3 (GOWN DISPOSABLE) ×2 IMPLANT
GOWN STRL REUS W/TWL LRG LVL3 (GOWN DISPOSABLE) ×1
GOWN STRL REUS W/TWL XL LVL3 (GOWN DISPOSABLE) ×2
HEMOSTAT SURGICEL 2X14 (HEMOSTASIS) IMPLANT
KIT BASIN OR (CUSTOM PROCEDURE TRAY) ×2 IMPLANT
KIT ROOM TURNOVER OR (KITS) ×2 IMPLANT
LIQUID BAND (GAUZE/BANDAGES/DRESSINGS) ×2 IMPLANT
NS IRRIG 1000ML POUR BTL (IV SOLUTION) ×2 IMPLANT
PACK SURGICAL SETUP 50X90 (CUSTOM PROCEDURE TRAY) ×2 IMPLANT
PAD ARMBOARD 7.5X6 YLW CONV (MISCELLANEOUS) ×4 IMPLANT
PENCIL BUTTON HOLSTER BLD 10FT (ELECTRODE) ×2 IMPLANT
SPONGE INTESTINAL PEANUT (DISPOSABLE) ×2 IMPLANT
SUT SILK 2 0 TIES 10X30 (SUTURE) IMPLANT
SUT VIC AB 2-0 CT1 27 (SUTURE) ×1
SUT VIC AB 2-0 CT1 TAPERPNT 27 (SUTURE) ×1 IMPLANT
SUT VIC AB 3-0 SH 18 (SUTURE) IMPLANT
SUT VIC AB 3-0 SH 27 (SUTURE) ×1
SUT VIC AB 3-0 SH 27X BRD (SUTURE) ×1 IMPLANT
SUT VICRYL 4-0 PS2 18IN ABS (SUTURE) ×2 IMPLANT
SWAB COLLECTION DEVICE MRSA (MISCELLANEOUS) IMPLANT
SYR BULB IRRIGATION 50ML (SYRINGE) ×2 IMPLANT
SYRINGE 10CC LL (SYRINGE) ×2 IMPLANT
TOWEL OR 17X24 6PK STRL BLUE (TOWEL DISPOSABLE) ×2 IMPLANT
TOWEL OR 17X26 10 PK STRL BLUE (TOWEL DISPOSABLE) ×2 IMPLANT
TUBE ANAEROBIC SPECIMEN COL (MISCELLANEOUS) IMPLANT
TUBE CONNECTING 12X1/4 (SUCTIONS) ×2 IMPLANT
WATER STERILE IRR 1000ML POUR (IV SOLUTION) ×2 IMPLANT

## 2014-08-17 NOTE — H&P (View-Only) (Signed)
PCP is Beverley Fiedler, MD Referring Provider is Si Gaul, MD  No chief complaint on file.   HPI: 69 yo woman sent for evaluation for mediastinoscopy.  Melanie Pittman is a 69 yo woman with a PMH significant for pulmonary embolus, HTN, and sleep apnea. She was doing well until she had a dental procedure in September and had seizures, possibly due to a local anesthetic used. In October hse had sudden onset of shortness of breath and chest pain. A CT angio showed bilateral PEs. She was started on Eliquis. She was also noted to have enlarged mediastinal lymph nodes on Melanie CT.  She saw Dr. Arbutus Ped and a PET/ CT was done. It showed bilateral hilar and mediastinal adenopathy that was hypermetabolic. There also was a small left supraclavicular node that was positive.  She has had shortness of breath with exertion since Melanie PE. She also complains of a "pulling" sensation on Melanie left side of her chest and left neck.  Melanie Pittman: At Melanie time of surgery this Pittman's most appropriate activity status/level should be described as:     0    Normal activity, no symptoms     1    Restricted in physical strenuous activity but ambulatory, able to do out light work     2    Ambulatory and capable of self care, unable to do work activities, up and about >50 % of waking hours                                  3    Only limited self care, in bed greater than 50% of waking hours     4    Completely disabled, no self care, confined to bed or chair     5    Moribund    Past Medical History  Diagnosis Date  . GERD (gastroesophageal reflux disease)   . Hypertension   . Arthritis   . PE (pulmonary embolism)   . Sciatica   . Vertigo   . Depression   . Anxiety   . Meniere disease   . OSA (obstructive sleep apnea)     Past Surgical History  Procedure Laterality Date  . Abdominal hysterectomy    . Knee surgery Right   . Abdominal hysterectomy    . Bowel resection    . Foot surgery     . Hand surgery      Family History  Problem Relation Age of Onset  . Colon polyps Father   . Arthritis Father   . Prostate cancer Father   . Heart disease Father   . Hyperlipidemia Father   . Hypertension Mother   . Cancer Mother     lung,breast  . Heart disease Sister   . Kidney disease Sister   . Cancer Sister     ovarian  . Kidney disease Son   . Diabetes Paternal Uncle   . Diabetes Cousin   . Stroke Other     Social History History  Substance Use Topics  . Smoking status: Never Smoker   . Smokeless tobacco: Never Used  . Alcohol Use: Yes     Comment: wine 1-2 weekly     Current Outpatient Prescriptions  Medication Sig Dispense Refill  . Coenzyme Q10 (CO Q 10 PO) Take 1 tablet by mouth daily.    Marland Kitchen esomeprazole (NEXIUM) 40 MG capsule Take 40 mg by mouth daily as needed.    Marland Kitchen  fish oil-omega-3 fatty acids 1000 MG capsule Take 1 g by mouth daily.     . Multiple Vitamin (MULTIVITAMIN WITH MINERALS) TABS Take 1 tablet by mouth daily.    Marland Kitchen apixaban (ELIQUIS) 5 MG TABS tablet Take 2 tablets (10 mg total) by mouth 2 (two) times daily. 72 tablet 0  . lisinopril-hydrochlorothiazide (PRINZIDE,ZESTORETIC) 20-12.5 MG per tablet Take 1 tablet by mouth daily.     No current facility-administered medications for this visit.    Allergies  Allergen Reactions  . Nutmeg Oil (Myristica Oil) Itching  . Hydrocodone Palpitations  . Penicillins Rash  . Prednisone Palpitations    Review of Systems  Constitutional: Positive for appetite change. Unexpected weight change: lost 12 pounds over 6 months, gained 3 pounds over last 3 months.  Respiratory: Positive for apnea (CPAP) and shortness of breath.   Cardiovascular: Positive for chest pain (tightness since PE, "pulling" in left chest and neck).  Gastrointestinal:       Reflux  Musculoskeletal: Positive for arthralgias (hip pain).       Leg cramps  Neurological: Positive for numbness.       Feels pressure posterior scalp several  times a day, "not a headache". Last 1-2 minutes  Hematological: Positive for adenopathy. Bruises/bleeds easily.  Psychiatric/Behavioral: Positive for dysphoric mood. Melanie Pittman is nervous/anxious.   All other systems reviewed and are negative.   BP 143/66 mmHg  Pulse 105  Temp(Src) 97.6 F (36.4 C)  Resp 19  Ht 5' 6.5" (1.689 m)  Wt 221 lb 1.6 oz (100.29 kg)  BMI 35.16 kg/m2  SpO2 100% Physical Exam  Constitutional: No distress.  obese  HENT:  Head: Normocephalic and atraumatic.  Eyes: EOM are normal. Pupils are equal, round, and reactive to light.  Neck: Neck supple. No tracheal deviation present. No thyromegaly present.  Lymphadenopathy:    She has no cervical adenopathy.  Vitals reviewed.    Diagnostic Tests: NUCLEAR MEDICINE PET SKULL BASE TO THIGH  TECHNIQUE: 11.12 mCi F-18 FDG was injected intravenously. Full-ring PET imaging was performed from Melanie skull base to thigh after Melanie radiotracer. CT data was obtained and used for attenuation correction and anatomic localization.  FASTING BLOOD GLUCOSE: Value: 112 mg/dl  COMPARISON: Chest CT 16/05/9603. Lumbar MRI 06/22/2014.  FINDINGS: NECK  There is a single hypermetabolic left supraclavicular lymph node which measures 8 mm short axis on image 42 and has an SUV max of 5.1. No other hypermetabolic cervical lymph nodes are demonstrated.There are no lesions of Melanie pharyngeal mucosal space.  CHEST  Melanie enlarged mediastinal and hilar lymph nodes demonstrated on previous CT are hypermetabolic. 12 mm right paratracheal node on image 64 has an SUV max of 6.4. Right hilar nodes demonstrate an SUV max of 9.3 and left hilar nodes an SUV max of 8.2. 9 mm node posterior to Melanie descending aorta on image 85 has an SUV max of 8.7. There is no hypermetabolic pulmonary activity or suspicious pulmonary nodule. Mild emphysematous changes are present. No specific pulmonary findings of sarcoidosis  identified.  ABDOMEN/PELVIS  Small portacaval nodes are mildly hypermetabolic (SUV max 4.8). No other hypermetabolic lymph nodes are identified within Melanie abdomen or pelvis. There is no abnormal metabolic activity within Melanie liver, spleen, adrenal glands or pancreas. Images through Melanie pelvis demonstrate probable pelvic floor laxity status post hysterectomy.  SKELETON  There is no hypermetabolic activity to suggest osseous metastatic disease.  IMPRESSION: 1. Multifocal hypermetabolic adenopathy within Melanie mediastinum and hilar stations, similar to chest CT  performed approximately 5 weeks ago. There is a single mildly hypermetabolic left supraclavicular node as well as mildly hypermetabolic lymph nodes in Melanie portacaval space. These nodes are nonspecific and could indicate a lymphoproliferative or inflammatory process. Tissue sampling should be considered. If not performed, short-term follow-up imaging recommended to assess stability. 2. No hypermetabolic pulmonary lesions identified. 3. No suspicious extra nodal activity within Melanie abdomen or pelvis.   Electronically Signed  By: Roxy HorsemanBill Veazey M.D.  On: 07/14/2014 13:44  CT ANGIOGRAPHY CHEST WITH CONTRAST  TECHNIQUE: Multidetector CT imaging of Melanie chest was performed using Melanie standard protocol during bolus administration of intravenous contrast. Multiplanar CT image reconstructions and MIPs were obtained to evaluate Melanie vascular anatomy.  CONTRAST: 100mL OMNIPAQUE IOHEXOL 350 MG/ML SOLN  COMPARISON: No priors.  FINDINGS: Mediastinum: Large volume of clot in Melanie distal main pulmonary arteries bilaterally extending into lobar, segmental and subsegmental sized pulmonary artery branches bilaterally. No central saddle embolus is noted. Pulmonic trunk is mildly dilated measuring up to 3.1 cm. Right ventricle appears mildly dilated right ventricle is slightly prominent measuring up to 4.4 cm in diameter.  Left ventricular diameter is up to 4.3 cm on today's non gated CT examination. RV to LV ratio of 1.02. No pericardial fluid, thickening or pericardial calcification. Numerous enlarged and borderline enlarged mediastinal and bilateral hilar lymph nodes. Specific examples include AP window lymph node measuring 1 cm, low right paratracheal lymph node measuring 13 mm, left hilar lymph node measuring 11 mm, and a subcarinal lymph node measuring 11 mm. Small hiatal hernia.  Lungs/Pleura: In Melanie perihilar aspect of Melanie right lung intimately associated with Melanie major fissure (image 48 of series 4) there is a smoothly marginated 12 x 21 mm nodular lesion, which Melanie other represent a nodule in Melanie right middle or lower lobe, or could simply represent an enlarged interlobar lymph node. No other suspicious appearing pulmonary nodules or masses are noted. No acute consolidative airspace disease. No pleural effusions.  Upper Abdomen: Unremarkable.  Musculoskeletal: There are no aggressive appearing lytic or blastic lesions noted in Melanie visualized portions of Melanie skeleton.  Review of Melanie MIP images confirms Melanie above findings.  IMPRESSION: 1. Positive for acute PE with CT evidence of right heartstrain (RV/LV Ratio = 1.02) consistent with at least submassive (intermediate risk) PE. Melanie presence of right heart strain has been associated with anincreased risk of morbidity and mortality. Consultation with Pulmonary and Critical Care Medicine is recommended. 2. Melanie Pittman has extensive mediastinal and bilateral hilar lymphadenopathy. In addition, there is a nodular opacity in Melanie perihilar aspect of Melanie right lung measuring 12 x 21 mm. It is uncertain whether or not this represents a pulmonary nodule, or is simply an enlarged interlobar lymph node (which is favored). Melanie overall appearance is favored to reflect a lymphoproliferative disorder, and clinical correlation is recommended. Follow-up  imaging is suggested to ensure resolution of these findings after appropriate medical therapy. Critical Value/emergent results were called by telephone at Melanie time of interpretation on 06/05/2014 at 7:30 pm to Dr. Pricilla LovelessSCOTT GOLDSTON, who verbally acknowledged these results.   Electronically Signed  By: Trudie Reedaniel Entrikin M.D.  On: 06/05/2014 19:30   Impression: 69 yo woman with a recent PE (< 3 months) who has been found to have mediastinal and bilateral hilar adenopathy. These nodes are hypermetabolic by PET and are suspicious for malignancy, specifically lymphoma. There is a small supraclavicular node visible on PET but it is not palpable on exam.  Dr. Arbutus PedMohamed needs a  tissue diagnosis to direct therapy.  I discussed Melanie proposed procedure of mediastinoscopy with Melanie Pittman.   We discussed Melanie general nature of Melanie procedure, Melanie need for general anesthesia, and Melanie incision to be used. She understands we would do this on an outpatient basis. I informed her of Melanie indications, risks,benefits and alternatives. She understands Melanie risks include, but are not limited to death, stroke, MI, DVT/PE, bleeding, possible need for transfusion, infections, pneumothorax, recurrent nerve injury, as well as Melanie possibility of unforeseeable complications. She accepts Melanie risks and agre to proceed.  We will plan to stop her Eliquis 24 hours prior to Melanie procedure and resume Melanie following day. Given Melanie short time interval bridging should not be necessary.  Plan: Mediastinoscopy Monday 08/17/2014

## 2014-08-17 NOTE — Anesthesia Postprocedure Evaluation (Signed)
  Anesthesia Post-op Note  Patient: Melanie Pittman  Procedure(s) Performed: Procedure(s): MEDIASTINOSCOPY (N/A)  Patient Location: PACU  Anesthesia Type:General  Level of Consciousness: awake, alert , oriented and patient cooperative  Airway and Oxygen Therapy: Patient Spontanous Breathing  Post-op Pain: mild  Post-op Assessment: Post-op Vital signs reviewed, Patient's Cardiovascular Status Stable, Respiratory Function Stable, Patent Airway, No signs of Nausea or vomiting and Pain level controlled  Post-op Vital Signs: stable  Last Vitals:  Filed Vitals:   08/17/14 1057  BP: 139/68  Pulse: 74  Temp:   Resp: 14    Complications: No apparent anesthesia complications

## 2014-08-17 NOTE — Discharge Instructions (Addendum)
Do not drive or engage in heavy physical activity for 24 hours.  You may shower tomorrow  There is a medical adhesive on the incision. It will begin to peel off in 7- 10 days  You have a prescription for a pain medication. It is a narcotic. Do not drive within 4 hours of taking the pain medication  Resume taking your anticoagulant (Eliquis) tomorrow  My office will contact you with follow up information

## 2014-08-17 NOTE — Anesthesia Preprocedure Evaluation (Addendum)
Anesthesia Evaluation  Patient identified by MRN, date of birth, ID band Patient awake    Reviewed: Allergy & Precautions, H&P , NPO status , Patient's Chart, lab work & pertinent test results  Airway Mallampati: III  TM Distance: >3 FB Neck ROM: Full    Dental  (+) Teeth Intact, Dental Advisory Given   Pulmonary sleep apnea ,          Cardiovascular hypertension,     Neuro/Psych Anxiety  Neuromuscular disease    GI/Hepatic GERD-  ,  Endo/Other    Renal/GU      Musculoskeletal  (+) Arthritis -,   Abdominal   Peds  Hematology  (+) Blood dyscrasia, ,   Anesthesia Other Findings   Reproductive/Obstetrics                            Anesthesia Physical Anesthesia Plan  ASA: II  Anesthesia Plan: General   Post-op Pain Management:    Induction: Intravenous  Airway Management Planned: Oral ETT  Additional Equipment: Arterial line  Intra-op Plan:   Post-operative Plan: Extubation in OR  Informed Consent: I have reviewed the patients History and Physical, chart, labs and discussed the procedure including the risks, benefits and alternatives for the proposed anesthesia with the patient or authorized representative who has indicated his/her understanding and acceptance.     Plan Discussed with: CRNA, Anesthesiologist and Surgeon  Anesthesia Plan Comments:         Anesthesia Quick Evaluation

## 2014-08-17 NOTE — Transfer of Care (Signed)
Immediate Anesthesia Transfer of Care Note  Patient: Melanie Pittman  Procedure(s) Performed: Procedure(s): MEDIASTINOSCOPY (N/A)  Patient Location: PACU  Anesthesia Type:General  Level of Consciousness: awake, alert  and oriented  Airway & Oxygen Therapy: Patient Spontanous Breathing and Patient connected to nasal cannula oxygen  Post-op Assessment: Report given to PACU RN, Post -op Vital signs reviewed and stable and Patient moving all extremities X 4  Post vital signs: Reviewed and stable  Complications: No apparent anesthesia complications

## 2014-08-17 NOTE — Interval H&P Note (Signed)
History and Physical Interval Note:  08/17/2014 7:25 AM  Melanie Pittman  has presented today for surgery, with the diagnosis of MEDIASTINAL LYMPHADENOPATHY  The various methods of treatment have been discussed with the patient and family. After consideration of risks, benefits and other options for treatment, the patient has consented to  Procedure(s): MEDIASTINOSCOPY (N/A) as a surgical intervention .  The patient's history has been reviewed, patient examined, no change in status, stable for surgery.  I have reviewed the patient's chart and labs.  Questions were answered to the patient's satisfaction.     Davidmichael Zarazua C

## 2014-08-17 NOTE — Anesthesia Procedure Notes (Signed)
Procedure Name: Intubation Date/Time: 08/17/2014 7:44 AM Performed by: Lanell Matar Pre-anesthesia Checklist: Patient identified, Timeout performed, Emergency Drugs available, Suction available and Patient being monitored Patient Re-evaluated:Patient Re-evaluated prior to inductionOxygen Delivery Method: Circle system utilized Preoxygenation: Pre-oxygenation with 100% oxygen Intubation Type: IV induction Ventilation: Mask ventilation without difficulty Laryngoscope Size: Miller and 2 Grade View: Grade I Tube type: Oral Tube size: 7.5 mm Number of attempts: 1 Airway Equipment and Method: Stylet Placement Confirmation: ETT inserted through vocal cords under direct vision,  breath sounds checked- equal and bilateral,  positive ETCO2 and CO2 detector Secured at: 22 cm Tube secured with: Tape Dental Injury: Teeth and Oropharynx as per pre-operative assessment

## 2014-08-17 NOTE — Brief Op Note (Signed)
08/17/2014  8:53 AM  PATIENT:  Melanie Pittman  70 y.o. 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   Delay start of Pharmacological VTE agent (>24hrs) due to surgical blood loss or risk of bleeding: yes'  FROZEN- GRANULOMATOUS INFLAMMATION

## 2014-08-18 ENCOUNTER — Encounter (HOSPITAL_COMMUNITY): Payer: Self-pay | Admitting: Thoracic Surgery (Cardiothoracic Vascular Surgery)

## 2014-08-18 NOTE — Op Note (Signed)
NAME:  Rose FillersBOSCHULTE, Verania             ACCOUNT NO.:  [id number]  MEDICAL RECORD NO.:  [id number]  LOCATION:  [facility]                         FACILITY:  [facility]  PHYSICIAN:  Salvatore DecentSteven C. Dorris FetchHendrickson, M.D.DATE OF BIRTH:  1945-11-13  DATE OF PROCEDURE:  08/17/2014 DATE OF DISCHARGE:  08/17/2014                              OPERATIVE REPORT   PREOPERATIVE DIAGNOSIS:  Mediastinal and hilar adenopathy.  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  PROCEDURE:  Mediastinoscopy.  SURGEON:  Salvatore DecentSteven C. Dorris FetchHendrickson, M.D.  ASSISTANT:  None.  ANESTHESIA:  General.  FINDINGS:  Enlarged mediastinal lymph nodes.  Frozen section of 4R node revealed a granulomatous disease, consistent with sarcoidosis.  CLINICAL NOTE:  [name] is a 69 year old woman who had a pulmonary embolus in October.  She was noted to have enlarged mediastinal lymph nodes on the CT with which the pulmonary embolus was diagnosed.  She saw Dr. Arbutus PedMohamed and a PET-CT was done, it showed bilateral hilar and mediastinal Adenopathy that was hypermetabolic.  She was advised to undergo biopsy with a differential diagnosis of lymphoma or other malignancy versus granulomatous disease.  The indications, risks, benefits, and alternatives were discussed in detail with the patient.  She understood and accepted the risks and agreed to proceed.  OPERATIVE NOTE:  [name] was brought to the operating room on August 17, 2014.  She had induction of general anesthesia and was intubated.  The neck and chest were prepped and draped in the usual sterile fashion.  Intravenous antibiotics were administered.  Sequential compression devices were placed on the calves.  A transverse incision was made 1 fingerbreadth above the sternal notch. It was carried through the skin and subcutaneous tissue.  The strap muscles were separated in the midline.  The pretracheal fascia was incised and the  plane into the mediastinum was developed bluntly.  The mediastinoscope was inserted.  Systematic inspection of the mediastinal lymph node was carried out.  There were multiple enlarged nodes seen. They were grayish in color and granular in texture.  A 4R node was identified first and it was biopsied and sent for frozen section.  Next, two level 7 nodes were identified.  These were both biopsied and were sent for permanent pathology.  Hemostasis was achieved with electrocautery.  The scope was removed and the incision was packed with gauze.  Frozen section subsequently returned, showing granulomas consistent with sarcoid.  The mediastinoscope was reinserted. A second level 4R node was identified.  This was biopsied and sent for both pathology as well as AFB and fungal cultures.  A final inspection was made for hemostasis, which was good.  The scope was withdrawn.  The incision was closed in standard fashion.  Subcuticular closure was used on the skin.  Dermabond was applied.  The patient was extubated in the operating room and taken to the postanesthetic care unit in good condition.     Salvatore DecentSteven C. Dorris FetchHendrickson, M.D.     SCH/MEDQ  D:  08/17/2014  T:  08/18/2014  Job:  [id number]

## 2014-08-19 ENCOUNTER — Emergency Department (HOSPITAL_COMMUNITY)
Admission: EM | Admit: 2014-08-19 | Discharge: 2014-08-19 | Disposition: A | Discharge status: Home / Self Care | Payer: Medicare PPO | Attending: Emergency Medicine | Admitting: Emergency Medicine

## 2014-08-19 ENCOUNTER — Encounter (HOSPITAL_COMMUNITY): Payer: Self-pay | Admitting: Emergency Medicine

## 2014-08-19 ENCOUNTER — Emergency Department (HOSPITAL_COMMUNITY): Payer: Medicare PPO

## 2014-08-19 DIAGNOSIS — Z79899 Other long term (current) drug therapy: Secondary | ICD-10-CM | POA: Insufficient documentation

## 2014-08-19 DIAGNOSIS — Z7902 Long term (current) use of antithrombotics/antiplatelets: Secondary | ICD-10-CM | POA: Insufficient documentation

## 2014-08-19 DIAGNOSIS — Z862 Personal history of diseases of the blood and blood-forming organs and certain disorders involving the immune mechanism: Secondary | ICD-10-CM | POA: Insufficient documentation

## 2014-08-19 DIAGNOSIS — Z86711 Personal history of pulmonary embolism: Secondary | ICD-10-CM | POA: Insufficient documentation

## 2014-08-19 DIAGNOSIS — R0602 Shortness of breath: Secondary | ICD-10-CM | POA: Diagnosis not present

## 2014-08-19 DIAGNOSIS — Z880 Allergy status to penicillin: Secondary | ICD-10-CM | POA: Insufficient documentation

## 2014-08-19 DIAGNOSIS — R609 Edema, unspecified: Secondary | ICD-10-CM | POA: Diagnosis not present

## 2014-08-19 DIAGNOSIS — F419 Anxiety disorder, unspecified: Secondary | ICD-10-CM | POA: Insufficient documentation

## 2014-08-19 DIAGNOSIS — Z8669 Personal history of other diseases of the nervous system and sense organs: Secondary | ICD-10-CM | POA: Insufficient documentation

## 2014-08-19 DIAGNOSIS — I10 Essential (primary) hypertension: Secondary | ICD-10-CM | POA: Diagnosis not present

## 2014-08-19 DIAGNOSIS — M7989 Other specified soft tissue disorders: Secondary | ICD-10-CM | POA: Diagnosis not present

## 2014-08-19 DIAGNOSIS — R079 Chest pain, unspecified: Secondary | ICD-10-CM | POA: Insufficient documentation

## 2014-08-19 DIAGNOSIS — F329 Major depressive disorder, single episode, unspecified: Secondary | ICD-10-CM | POA: Diagnosis not present

## 2014-08-19 DIAGNOSIS — R110 Nausea: Secondary | ICD-10-CM | POA: Insufficient documentation

## 2014-08-19 DIAGNOSIS — K219 Gastro-esophageal reflux disease without esophagitis: Secondary | ICD-10-CM | POA: Diagnosis not present

## 2014-08-19 LAB — COMPREHENSIVE METABOLIC PANEL
ALT: 18 U/L (ref 0–35)
AST: 21 U/L (ref 0–37)
Albumin: 3.5 g/dL (ref 3.5–5.2)
Alkaline Phosphatase: 70 U/L (ref 39–117)
Anion gap: 9 (ref 5–15)
BUN: 15 mg/dL (ref 6–23)
CO2: 25 mmol/L (ref 19–32)
Calcium: 8.8 mg/dL (ref 8.4–10.5)
Chloride: 102 mEq/L (ref 96–112)
Creatinine, Ser: 0.53 mg/dL (ref 0.50–1.10)
GFR calc Af Amer: >90 mL/min (ref >90)
GFR calc non Af Amer: >90 mL/min (ref >90)
Glucose, Bld: 116 mg/dL — ABNORMAL HIGH (ref 70–99)
POTASSIUM: 3.5 mmol/L (ref 3.5–5.1)
Sodium: 136 mmol/L (ref 135–145)
Total Bilirubin: 0.5 mg/dL (ref 0.3–1.2)
Total Protein: 6.6 g/dL (ref 6.0–8.3)

## 2014-08-19 LAB — BRAIN NATRIURETIC PEPTIDE: B NATRIURETIC PEPTIDE 5: 180.3 pg/mL — AB (ref 0.0–100.0)

## 2014-08-19 LAB — CBC WITH DIFFERENTIAL/PLATELET
BASOS PCT: 0 % (ref 0–1)
Basophils Absolute: 0 10*3/uL (ref 0.0–0.1)
Eosinophils Absolute: 0.2 10*3/uL (ref 0.0–0.7)
Eosinophils Relative: 2 % (ref 0–5)
HCT: 32.7 % — ABNORMAL LOW (ref 36.0–46.0)
Hemoglobin: 10.8 g/dL — ABNORMAL LOW (ref 12.0–15.0)
Lymphocytes Relative: 12 % (ref 12–46)
Lymphs Abs: 1 10*3/uL (ref 0.7–4.0)
MCH: 30.8 pg (ref 26.0–34.0)
MCHC: 33 g/dL (ref 30.0–36.0)
MCV: 93.2 fL (ref 78.0–100.0)
Monocytes Absolute: 0.6 10*3/uL (ref 0.1–1.0)
Monocytes Relative: 7 % (ref 3–12)
Neutro Abs: 6.8 10*3/uL (ref 1.7–7.7)
Neutrophils Relative %: 79 % — ABNORMAL HIGH (ref 43–77)
PLATELETS: 217 10*3/uL (ref 150–400)
RBC: 3.51 MIL/uL — AB (ref 3.87–5.11)
RDW: 12.9 % (ref 11.5–15.5)
WBC: 8.6 10*3/uL (ref 4.0–10.5)

## 2014-08-19 LAB — TROPONIN I

## 2014-08-19 MED ORDER — OXYCODONE-ACETAMINOPHEN 5-325 MG PO TABS: 1.0000 | ORAL_TABLET | Freq: Four times a day (QID) | ORAL | Status: DC | PRN

## 2014-08-19 MED ORDER — FUROSEMIDE 20 MG PO TABS: 20.0000 mg | ORAL_TABLET | Freq: Two times a day (BID) | ORAL | Status: DC

## 2014-08-19 MED ORDER — DIPHENHYDRAMINE HCL 50 MG/ML IJ SOLN
25.0000 mg | Freq: Once | INTRAMUSCULAR | Status: AC
  Administered 2014-08-19: 25 mg via INTRAVENOUS
  Filled 2014-08-19: qty 1

## 2014-08-19 MED ORDER — FUROSEMIDE 10 MG/ML IJ SOLN
20.0000 mg | INTRAMUSCULAR | Status: AC
  Administered 2014-08-19: 20 mg via INTRAVENOUS
  Filled 2014-08-19: qty 4

## 2014-08-19 MED ORDER — OXYCODONE-ACETAMINOPHEN 5-325 MG PO TABS
1.0000 | ORAL_TABLET | Freq: Once | ORAL | Status: AC
  Administered 2014-08-19: 1 via ORAL
  Filled 2014-08-19: qty 1

## 2014-08-19 MED ORDER — ONDANSETRON HCL 4 MG/2ML IJ SOLN
4.0000 mg | Freq: Once | INTRAMUSCULAR | Status: AC
  Administered 2014-08-19: 4 mg via INTRAVENOUS
  Filled 2014-08-19: qty 2

## 2014-08-19 NOTE — ED Provider Notes (Signed)
CSN: [id number]     Arrival date & time 08/19/14  4098 History   First MD Initiated Contact with Patient 08/19/14 [phone]     Chief Complaint  Patient presents with  . Edema     (Consider location/radiation/quality/duration/timing/severity/associated sxs/prior Treatment) Patient is a 69 y.o. female presenting with chest pain. The history is provided by the patient and a relative. No language interpreter was used.  Chest Pain Associated symptoms: nausea and shortness of breath   Associated symptoms: no abdominal pain, no fever and not vomiting   Associated symptoms comment:  She is here with complaint of post-surgical pain after mediastinoscopy performed earlier this week (08/17/14) with biopsy of concerning adenopathy discovered on recent CT. She states her post-op pain is uncontrolled with Tramadol. No fever. She also complains of a 10-pound weight gain in 2 days, SOB and swelling in arms, legs and abdomen. She has a history of PE on Eliquis. She has experienced mild nausea without vomiting.   Past Medical History  Diagnosis Date  . GERD (gastroesophageal reflux disease)   . Hypertension   . Arthritis   . PE (pulmonary embolism)   . Sciatica   . Vertigo   . Depression   . Anxiety   . Meniere disease   . OSA (obstructive sleep apnea)   . Complication of anesthesia     had some type reaction to anesthesia at dentist office and was brought to ED. See notes 04/11/14  . Shortness of breath dyspnea     with exertion  . Blood dyscrasia     bruises easily   Past Surgical History  Procedure Laterality Date  . Abdominal hysterectomy    . Knee surgery Right   . Abdominal hysterectomy    . Bowel resection    . Foot surgery    . Hand surgery    . Tonsillectomy    . Eye surgery Left     cataract  . Mediastinoscopy N/A 08/17/2014    Procedure: MEDIASTINOSCOPY;  Surgeon: Loreli Slot, MD;  Location: [facility];  Service: Thoracic;  Laterality: N/A;   Family History  Problem Relation Age  of Onset  . Colon polyps Father   . Arthritis Father   . Prostate cancer Father   . Heart disease Father   . Hyperlipidemia Father   . Hypertension Mother   . Cancer Mother     lung,breast  . Heart disease Sister   . Kidney disease Sister   . Cancer Sister     ovarian  . Kidney disease Son   . Diabetes Paternal Uncle   . Diabetes Cousin   . Stroke Other    History  Substance Use Topics  . Smoking status: Never Smoker   . Smokeless tobacco: Never Used  . Alcohol Use: Yes     Comment: wine 1-2 occasionally   OB History    No data available     Review of Systems  Constitutional: Negative for fever and chills.  HENT: Negative.   Respiratory: Positive for shortness of breath.   Cardiovascular: Positive for chest pain and leg swelling.  Gastrointestinal: Positive for nausea. Negative for vomiting and abdominal pain.  Musculoskeletal: Negative.   Skin: Negative.  Negative for color change.  Neurological: Negative.       Allergies  Peanuts; Nutmeg oil (myristica oil); Hydrocodone; Penicillins; and Prednisone  Home Medications   Prior to Admission medications   Medication Sig Start Date End Date Taking? Authorizing Provider  apixaban (ELIQUIS) 5 MG  TABS tablet Take 1 tablet (5 mg total) by mouth 2 (two) times daily. 08/17/14 10/19/14  Loreli SlotSteven C Hendrickson, MD  esomeprazole (NEXIUM) 40 MG capsule Take 40 mg by mouth daily as needed.    Historical Provider, MD  fish oil-omega-3 fatty acids 1000 MG capsule Take 1 g by mouth daily.     Historical Provider, MD  lisinopril-hydrochlorothiazide (PRINZIDE,ZESTORETIC) 20-12.5 MG per tablet Take 1 tablet by mouth daily.    Historical Provider, MD  Multiple Vitamin (MULTIVITAMIN WITH MINERALS) TABS Take 1 tablet by mouth daily.    Historical Provider, MD  traMADol (ULTRAM) 50 MG tablet Take 1-2 tablets (50-100 mg total) by mouth every 6 (six) hours as needed for moderate pain. 08/17/14   Loreli SlotSteven C Hendrickson, MD   BP 143/65 mmHg  Pulse  87  Temp(Src) 97.8 F (36.6 C) (Oral)  Resp 16  SpO2 93% Physical Exam  Constitutional: She is oriented to person, place, and time. She appears well-developed and well-nourished.  HENT:  Head: Normocephalic.  Neck: Normal range of motion. Neck supple.  Cardiovascular: Normal rate and regular rhythm.   Pulmonary/Chest: Effort normal and breath sounds normal. She has no wheezes. She has no rales.  Incision at sternal notch unremarkable in appearance, without redness or drainage.  Abdominal: Soft. Bowel sounds are normal. There is no tenderness. There is no rebound and no guarding.  Musculoskeletal: Normal range of motion.  Mild peripheral edema.  Neurological: She is alert and oriented to person, place, and time.  Skin: Skin is warm and dry. No rash noted.  Psychiatric: She has a normal mood and affect.    ED Course  Procedures (including critical care time) Labs Review Labs Reviewed - No data to display  Imaging Review Dg Chest 2 View  08/17/2014   CLINICAL DATA:  Preoperative. Mediastinal lymphadenopathy. Shortness of breath.  EXAM: CHEST  2 VIEW  COMPARISON:  Chest 06/05/2014.  PET-CT 07/14/2014.  FINDINGS: Normal heart size and pulmonary vascularity. Unchanged prominence of hilar structures bilaterally, likely corresponding to lymphadenopathy seen on previous chest CT. No focal airspace disease in the lungs. No blunting of costophrenic angles. No pneumothorax. Calcification of the aorta.  IMPRESSION: B prominent perihilar structures consistent with lymphadenopathy seen on CT. No evidence of active pulmonary disease.   Electronically Signed   By: Burman NievesWilliam  Stevens M.D.   On: 08/17/2014 06:32     EKG Interpretation None      MDM   Final diagnoses:  None    Plan: labs, CXR, EKG; IV (no fluids) Zofran. The patient states she does has hypersensitivity to narcotics and prefers PO medication; Percocet ordered.   Re-assess when labs/work up complete. Call Dr. Dorris FetchHendrickson to  report.    Arnoldo HookerShari A Joel Cowin, PA-C 08/19/14 0540  Loren Raceravid Yelverton, MD 08/21/14 0005

## 2014-08-19 NOTE — ED Notes (Signed)
Pt states she is retaining fluid  Pt states she has gained 10 lbs in 2 days  Pt states she had a lung [facility] on Monday [facility]

## 2014-08-19 NOTE — ED Provider Notes (Signed)
6:01 AM Patient signed out to me by Upstill, PA-C.  Patient here complaining of weight gain, swelling in arms, legs, and abdomen and SOB x 2 days.  Patient recently underwent mediastinoscopy with lymph node biopsy by Dr. Dorris FetchHendrickson of CT surgery this week.    Plan:  Follow-up up on labs.  Touch base with CT surgery.   PE: Gen: A&O x4 HEENT: PERRL, EOM intact CHEST: RRR, no m/r/g LUNGS: CTAB, no w/r/r ABD: BS x 4, ND/NT EXT: Mild peripheral edema, strong peripheral pulses NEURO: Sensation and strength intact bilaterally  Results for orders placed or performed during the hospital encounter of 08/19/14  Comprehensive metabolic panel  Result Value Ref Range   Sodium 136 135 - 145 mmol/L   Potassium 3.5 3.5 - 5.1 mmol/L   Chloride 102 96 - 112 mEq/L   CO2 25 19 - 32 mmol/L   Glucose, Bld 116 (H) 70 - 99 mg/dL   BUN 15 6 - 23 mg/dL   Creatinine, Ser 4.090.53 0.50 - 1.10 mg/dL   Calcium 8.8 8.4 - 81.110.5 mg/dL   Total Protein 6.6 6.0 - 8.3 g/dL   Albumin 3.5 3.5 - 5.2 g/dL   AST 21 0 - 37 U/L   ALT 18 0 - 35 U/L   Alkaline Phosphatase 70 39 - 117 U/L   Total Bilirubin 0.5 0.3 - 1.2 mg/dL   GFR calc non Af Amer >90 >90 mL/min   GFR calc Af Amer >90 >90 mL/min   Anion gap 9 5 - 15  CBC with Differential  Result Value Ref Range   WBC 8.6 4.0 - 10.5 K/uL   RBC 3.51 (L) 3.87 - 5.11 MIL/uL   Hemoglobin 10.8 (L) 12.0 - 15.0 g/dL   HCT 91.432.7 (L) 78.236.0 - 95.646.0 %   MCV 93.2 78.0 - 100.0 fL   MCH 30.8 26.0 - 34.0 pg   MCHC 33.0 30.0 - 36.0 g/dL   RDW 21.312.9 08.611.5 - 57.815.5 %   Platelets 217 150 - 400 K/uL   Neutrophils Relative % 79 (H) 43 - 77 %   Neutro Abs 6.8 1.7 - 7.7 K/uL   Lymphocytes Relative 12 12 - 46 %   Lymphs Abs 1.0 0.7 - 4.0 K/uL   Monocytes Relative 7 3 - 12 %   Monocytes Absolute 0.6 0.1 - 1.0 K/uL   Eosinophils Relative 2 0 - 5 %   Eosinophils Absolute 0.2 0.0 - 0.7 K/uL   Basophils Relative 0 0 - 1 %   Basophils Absolute 0.0 0.0 - 0.1 K/uL  Brain natriuretic peptide   Result Value Ref Range   B Natriuretic Peptide 180.3 (H) 0.0 - 100.0 pg/mL  Troponin I  Result Value Ref Range   Troponin I <0.03 <0.031 ng/mL   Dg Chest 2 View  08/17/2014   CLINICAL DATA:  Preoperative. Mediastinal lymphadenopathy. Shortness of breath.  EXAM: CHEST  2 VIEW  COMPARISON:  Chest 06/05/2014.  PET-CT 07/14/2014.  FINDINGS: Normal heart size and pulmonary vascularity. Unchanged prominence of hilar structures bilaterally, likely corresponding to lymphadenopathy seen on previous chest CT. No focal airspace disease in the lungs. No blunting of costophrenic angles. No pneumothorax. Calcification of the aorta.  IMPRESSION: B prominent perihilar structures consistent with lymphadenopathy seen on CT. No evidence of active pulmonary disease.   Electronically Signed   By: Burman NievesWilliam  Stevens M.D.   On: 08/17/2014 06:32   Dg Chest Port 1 View  08/19/2014   CLINICAL DATA:  Mucinous could be  with biopsy on 08/17/2014. Abdominal swelling and 10 lb weight gain over the past 48 hr. Shortness of breath. Extremity swelling and nausea this a.m. History of pulmonary embolus.  EXAM: PORTABLE CHEST - 1 VIEW  COMPARISON:  08/17/2014  FINDINGS: Shallow inspiration with atelectasis in the lung bases new since previous study. Borderline heart size with prominent pulmonary vascularity suggesting developing vascular congestion. Prominent right perihilar structures again demonstrated consistent with lymphadenopathy seen on previous CT chest. No pneumothorax. No blunting of costophrenic angles.  IMPRESSION: Shallow inspiration with mild cardiac enlargement and mild developing pulmonary vascular congestion. Atelectasis in the lung bases.   Electronically Signed   By: Burman Nieves M.D.   On: 08/19/2014 06:09      7:49 AM Patient states that she still feels somewhat SOB.  O2 sat is 92% resting.  Will ambulate with pulse ox and see how she does.  She does not have increased work of breathing, nor does she have labored  speech.  Her BNP is elevated to 180 today.  Discussed with Dr. Juleen China, who agrees that if she can ambulate and maintain adequate oxygenation, she can be discharged with some lasix.  She has good renal function.  Anticipate starting low-dose lasix and discharge to home.  8:27 AM Plan to discharge with lasix.  Patient ambulates maintaining >90% O2 Sat.  Highly doubt PE at this time.  No new chest pain.  No tachycardia.  Not hypoxic.  Discussed patient with Dr. Dorris Fetch, who agrees that this is likely not related to prior biopsy.  Increased fluid could be related to decreased movement post-op.  Patient seen by and discussed with Dr. Juleen China, who agrees with plan.  Roxy Horseman, PA-C 08/19/14 1610  Loren Racer, MD 08/21/14 [phone]

## 2014-08-19 NOTE — ED Notes (Signed)
Patient ambulated well, just a bit dizzy and nausous. O2 states were 94.

## 2014-08-19 NOTE — Discharge Instructions (Signed)

## 2014-08-20 LAB — TISSUE CULTURE: CULTURE: NO GROWTH

## 2014-08-21 ENCOUNTER — Telehealth: Payer: Self-pay | Admitting: *Deleted

## 2014-08-21 NOTE — Telephone Encounter (Signed)
Melanie Pittman has called with complaints of retaining fluid , hurting all over, having a fever, even though she has not taken her temperature and coughing with clear phlegm. She is a pt of Dr Arbutus PedMohamed and was referred to Dr. Dorris FetchHendrickson for mediastinsocopy which was performed on 08/17/14.  She went to the ER for the edema on 08/19/14/and was started on Lasix and given a stronger pain med.  I told her she should see Dr. Arbutus PedMohamed and I would call his nurses to get in touch with her which I did.

## 2014-08-21 NOTE — Telephone Encounter (Signed)
Called and spoke to pt regarding pt's concerns of having pain, cough, swelling, questionable fever.  Per Dr Donnald GarreMKM, theses symptoms are not r/t what he is working patient up for.  She needs to contact her PCP.  Spoke to pt, she states that she has already contact her PCP.

## 2014-08-25 ENCOUNTER — Encounter: Payer: Self-pay | Admitting: Thoracic Surgery (Cardiothoracic Vascular Surgery)

## 2014-08-25 ENCOUNTER — Other Ambulatory Visit: Payer: Self-pay | Admitting: *Deleted

## 2014-08-25 ENCOUNTER — Ambulatory Visit (INDEPENDENT_AMBULATORY_CARE_PROVIDER_SITE_OTHER): Payer: Self-pay | Admitting: Thoracic Surgery (Cardiothoracic Vascular Surgery)

## 2014-08-25 VITALS — BP 126/71 | HR 106 | Resp 20 | Ht 66.5 in | Wt 229.0 lb

## 2014-08-25 DIAGNOSIS — R599 Enlarged lymph nodes, unspecified: Secondary | ICD-10-CM

## 2014-08-25 DIAGNOSIS — R590 Localized enlarged lymph nodes: Secondary | ICD-10-CM

## 2014-08-25 DIAGNOSIS — R609 Edema, unspecified: Secondary | ICD-10-CM

## 2014-08-25 MED ORDER — FUROSEMIDE 20 MG PO TABS: 20.0000 mg | ORAL_TABLET | Freq: Two times a day (BID) | ORAL | Status: DC | PRN

## 2014-08-25 NOTE — Progress Notes (Signed)
HPI:  Ms. Melanie Pittman is a 69 year old woman with mediastinal adenopathy who underwent a mediastinoscopy on January 4. Her biopsies showed non-necrotizing granulomas consistent with sarcoidosis. She went to the emergency room for diffuse swelling on postoperative day #2. Her saturations were okay and no definitive cause for the swelling was identified. She was treated with Lasix 20 mg by mouth twice a day and the swelling has improved. She had a lot of chest pain and back pain for the first few days. She could not tolerate oxycodone or tramadol. She also has had problems with hydrocodone in the past. She has been taking Tylenol for the pain and says that it is improving slowly.  Past Medical History  Diagnosis Date  . GERD (gastroesophageal reflux disease)   . Hypertension   . Arthritis   . PE (pulmonary embolism)   . Sciatica   . Vertigo   . Depression   . Anxiety   . Meniere disease   . OSA (obstructive sleep apnea)   . Complication of anesthesia     had some type reaction to anesthesia at dentist office and was brought to ED. See notes 04/11/14  . Shortness of breath dyspnea     with exertion  . Blood dyscrasia     bruises easily      Current Outpatient Prescriptions  Medication Sig Dispense Refill  . apixaban (ELIQUIS) 5 MG TABS tablet Take 1 tablet (5 mg total) by mouth 2 (two) times daily. 72 tablet 0  . fish oil-omega-3 fatty acids 1000 MG capsule Take 1 g by mouth daily.     . furosemide (LASIX) 20 MG tablet Take 1 tablet (20 mg total) by mouth 2 (two) times daily between meals as needed for fluid. 14 tablet 1  . lisinopril-hydrochlorothiazide (PRINZIDE,ZESTORETIC) 20-12.5 MG per tablet Take 1 tablet by mouth daily.    . Multiple Vitamin (MULTIVITAMIN WITH MINERALS) TABS Take 1 tablet by mouth daily.    . pantoprazole (PROTONIX) 40 MG tablet Take 1 tablet by mouth daily.  0   No current facility-administered medications for this visit.    Physical Exam BP 126/71 mmHg   Pulse 106  Resp 20  Ht 5' 6.5" (1.689 m)  Wt 229 lb (103.874 kg)  BMI 36.41 kg/m2  SpO2 95% Anxious 69 year old woman in no acute distress Alert and oriented 3 with no focal deficits Incision healing well Cardiac mildly tachycardic, regular Lungs clear with equal breath sounds bilaterally  Diagnostic Tests: PATHOLOGY INAL DIAGNOSIS Diagnosis 1. Lymph node, biopsy, 4 R - NON-NECROTIZING EPITHELIOID GRANULOMATA, SEE COMMENT. 2. Lymph node, biopsy, 7 - NON-NECROTIZING EPITHELIOID GRANULOMATA, SEE COMMENT. 3. Lymph node, biopsy, 7 #2 - NON-NECROTIZING EPITHELIOID GRANULOMATA, SEE COMMENT. 4. Lymph node, biopsy, 4 R #2 - NON-NECROTIZING EPITHELIOID GRANULOMATA, SEE COMMENT. Microscopic Comment 1. - 4. In each part, there is lymph node tissue extensively involved by non-necrotizing epithelioid granulomata. AFB, GMS, and PAS stains are pending (parts 1 and 2) and the results will be reported in an addendum. (CRR:ecj 08/18/2014) [place] RUND DO Pathologist, Electronic Signature (Case signed 08/18/2014)  Impression: 69 year old woman with mediastinal adenopathy. Biopsies showed no evidence of malignancy. There were nonnecrotizing granulomas consistent with sarcoidosis.  She had a lot of postoperative pain. In large part this was due to intolerance of narcotics. This does seem to be improving. She currently is using Tylenol for pain.  She also developed a diffuse swelling. She said this involved her upper and lower extremities as well as her entire torso. She  said it was worse in her abdomen. I really don't know what to make of that unless it was some type of reaction to an anesthetic. She does have a lot of sensitivities to medications. That is improved with Lasix 20 mg by mouth twice a day. She is about out of her Lasix. I gave her a new prescription for Lasix 20 mg by mouth twice a day when necessary for swelling. He did recommend that she follow up with Dr. Barbaraann Barthel regarding that issue if  it recurs.  She appears to have sarcoidosis. I am going to refer her to pulmonology for evaluation and treatment. She has had adverse reactions to prednisone in the past; start anything empirically today.  Plan: Refer to pulmonology for management of sarcoidosis

## 2014-08-27 ENCOUNTER — Other Ambulatory Visit (HOSPITAL_BASED_OUTPATIENT_CLINIC_OR_DEPARTMENT_OTHER): Payer: Medicare PPO

## 2014-08-27 ENCOUNTER — Encounter: Payer: Self-pay | Admitting: Internal Medicine

## 2014-08-27 ENCOUNTER — Ambulatory Visit (HOSPITAL_BASED_OUTPATIENT_CLINIC_OR_DEPARTMENT_OTHER): Payer: Medicare PPO | Admitting: Internal Medicine

## 2014-08-27 VITALS — BP 127/56 | HR 98 | Temp 97.3°F | Resp 18 | Ht 66.5 in | Wt 220.3 lb

## 2014-08-27 DIAGNOSIS — D860 Sarcoidosis of lung: Secondary | ICD-10-CM | POA: Diagnosis not present

## 2014-08-27 DIAGNOSIS — R590 Localized enlarged lymph nodes: Secondary | ICD-10-CM

## 2014-08-27 LAB — CBC WITH DIFFERENTIAL/PLATELET
BASO%: 1 % (ref 0.0–2.0)
BASOS ABS: 0.1 10*3/uL (ref 0.0–0.1)
EOS ABS: 0.2 10*3/uL (ref 0.0–0.5)
EOS%: 3.4 % (ref 0.0–7.0)
HCT: 37.4 % (ref 34.8–46.6)
HGB: 12.3 g/dL (ref 11.6–15.9)
LYMPH#: 0.9 10*3/uL (ref 0.9–3.3)
LYMPH%: 14.9 % (ref 14.0–49.7)
MCH: 30.6 pg (ref 25.1–34.0)
MCHC: 32.9 g/dL (ref 31.5–36.0)
MCV: 92.9 fL (ref 79.5–101.0)
MONO#: 0.5 10*3/uL (ref 0.1–0.9)
MONO%: 8.2 % (ref 0.0–14.0)
NEUT#: 4.6 10*3/uL (ref 1.5–6.5)
NEUT%: 72.5 % (ref 38.4–76.8)
Platelets: 275 10*3/uL (ref 145–400)
RBC: 4.02 10*6/uL (ref 3.70–5.45)
RDW: 13.4 % (ref 11.2–14.5)
WBC: 6.3 10*3/uL (ref 3.9–10.3)

## 2014-08-27 LAB — COMPREHENSIVE METABOLIC PANEL (CC13)
ALT: 17 U/L (ref 0–55)
ANION GAP: 9 meq/L (ref 3–11)
AST: 19 U/L (ref 5–34)
Albumin: 3.7 g/dL (ref 3.5–5.0)
Alkaline Phosphatase: 96 U/L (ref 40–150)
BUN: 12.6 mg/dL (ref 7.0–26.0)
CO2: 23 meq/L (ref 22–29)
Calcium: 9.1 mg/dL (ref 8.4–10.4)
Chloride: 106 mEq/L (ref 98–109)
Creatinine: 0.7 mg/dL (ref 0.6–1.1)
EGFR: >90 mL/min/{1.73_m2} (ref >90)
Glucose: 99 mg/dl (ref 70–140)
Potassium: 3.6 mEq/L (ref 3.5–5.1)
SODIUM: 138 meq/L (ref 136–145)
TOTAL PROTEIN: 7.1 g/dL (ref 6.4–8.3)
Total Bilirubin: 0.47 mg/dL (ref 0.20–1.20)

## 2014-08-27 LAB — LACTATE DEHYDROGENASE (CC13): LDH: 174 U/L (ref 125–245)

## 2014-08-27 NOTE — Progress Notes (Signed)
[facility] Telephone:([phone]   [phone]) [phone]  OFFICE PROGRESS NOTE  Beverley FiedlerANKINS,VICTORIA, MD [address] [place] [id number]  DIAGNOSIS: Mediastinal lymphadenopathy, consistent with sarcoidosis.  PRIOR THERAPY: None   CURRENT THERAPY: None  INTERVAL HISTORY: Melanie Pittman 69 y.o. female returns to the clinic today for follow-up visit. The patient was seen by Dr. Dorris FetchHendrickson and she underwent mediastinoscopy with biopsy of the mediastinal lymph nodes. The final pathology (Accession: [id number]) showed nonnecrotizing epithelioid granulomata consistent with sarcoidosis. She was referred to Dr. Kendrick FriesMcQuaid, with pulmonary Medicine for evaluation of her condition and she is expected to see him next month. She is feeling fine today with no specific complaints except for mild chest congestion. She denied having any significant chest pain or hemoptysis. The patient denied having any significant weight loss or night sweats. She has no nausea or vomiting, no fever or chills.   MEDICAL HISTORY: Past Medical History  Diagnosis Date  . GERD (gastroesophageal reflux disease)   . Hypertension   . Arthritis   . PE (pulmonary embolism)   . Sciatica   . Vertigo   . Depression   . Anxiety   . Meniere disease   . OSA (obstructive sleep apnea)   . Complication of anesthesia     had some type reaction to anesthesia at dentist office and was brought to ED. See notes 04/11/14  . Shortness of breath dyspnea     with exertion  . Blood dyscrasia     bruises easily    ALLERGIES:  is allergic to peanuts; nutmeg oil (myristica oil); hydrocodone; other; penicillins; and prednisone.  MEDICATIONS:  Current Outpatient Prescriptions  Medication Sig Dispense Refill  . apixaban (ELIQUIS) 5 MG TABS tablet Take 1 tablet (5 mg total) by mouth 2 (two) times daily. 72 tablet 0  . fish oil-omega-3 fatty acids 1000 MG capsule Take 1 g by mouth daily.     . furosemide (LASIX) 20 MG  tablet Take 1 tablet (20 mg total) by mouth 2 (two) times daily between meals as needed for fluid. 14 tablet 1  . lisinopril-hydrochlorothiazide (PRINZIDE,ZESTORETIC) 20-12.5 MG per tablet Take 1 tablet by mouth daily.    . Multiple Vitamin (MULTIVITAMIN WITH MINERALS) TABS Take 1 tablet by mouth daily.    . pantoprazole (PROTONIX) 40 MG tablet Take 1 tablet by mouth daily.  0   No current facility-administered medications for this visit.    SURGICAL HISTORY:  Past Surgical History  Procedure Laterality Date  . Abdominal hysterectomy    . Knee surgery Right   . Abdominal hysterectomy    . Bowel resection    . Foot surgery    . Hand surgery    . Tonsillectomy    . Eye surgery Left     cataract  . Mediastinoscopy N/A 08/17/2014    Procedure: MEDIASTINOSCOPY;  Surgeon: Loreli SlotSteven C Hendrickson, MD;  [facility];  Service: Thoracic;  Laterality: N/A;    REVIEW OF SYSTEMS:  A comprehensive review of systems was negative except for: Constitutional: positive for fatigue Respiratory: positive for cough and Chest congestion Musculoskeletal: positive for arthralgias   PHYSICAL EXAMINATION: General appearance: alert, cooperative, fatigued and no distress Head: Normocephalic, without obvious abnormality, atraumatic Neck: no adenopathy, no JVD, supple, symmetrical, trachea midline and thyroid not enlarged, symmetric, no tenderness/mass/nodules Lymph nodes: Cervical, supraclavicular, and axillary nodes normal. Resp: clear to auscultation bilaterally Back: symmetric, no curvature. ROM normal. No CVA tenderness. Cardio: regular rate and  rhythm, S1, S2 normal, no murmur, click, rub or gallop GI: soft, non-tender; bowel sounds normal; no masses,  no organomegaly Extremities: extremities normal, atraumatic, no cyanosis or edema  ECOG PERFORMANCE STATUS: 1 - Symptomatic but completely ambulatory  Blood pressure 127/56, pulse 98, temperature 97.3 F (36.3 C), temperature source Oral, resp. rate 18,  height 5' 6.5" (1.689 m), weight 220 lb 4.8 oz (99.927 kg), SpO2 99 %.  LABORATORY DATA: Lab Results  Component Value Date   WBC 6.3 08/27/2014   HGB 12.3 08/27/2014   HCT 37.4 08/27/2014   MCV 92.9 08/27/2014   PLT 275 08/27/2014      Chemistry      Component Value Date/Time   NA 138 08/27/2014 0855   NA 136 08/19/2014 0631   K 3.6 08/27/2014 0855   K 3.5 08/19/2014 0631   CL 102 08/19/2014 0631   CO2 23 08/27/2014 0855   CO2 25 08/19/2014 0631   BUN 12.6 08/27/2014 0855   BUN 15 08/19/2014 0631   CREATININE 0.7 08/27/2014 0855   CREATININE 0.53 08/19/2014 0631      Component Value Date/Time   CALCIUM 9.1 08/27/2014 0855   CALCIUM 8.8 08/19/2014 0631   ALKPHOS 96 08/27/2014 0855   ALKPHOS 70 08/19/2014 0631   AST 19 08/27/2014 0855   AST 21 08/19/2014 0631   ALT 17 08/27/2014 0855   ALT 18 08/19/2014 0631   BILITOT 0.47 08/27/2014 0855   BILITOT 0.5 08/19/2014 0631       RADIOGRAPHIC STUDIES: Dg Chest 2 View  08/17/2014   CLINICAL DATA:  Preoperative. Mediastinal lymphadenopathy. Shortness of breath.  EXAM: CHEST  2 VIEW  COMPARISON:  Chest 06/05/2014.  PET-CT 07/14/2014.  FINDINGS: Normal heart size and pulmonary vascularity. Unchanged prominence of hilar structures bilaterally, likely corresponding to lymphadenopathy seen on previous chest CT. No focal airspace disease in the lungs. No blunting of costophrenic angles. No pneumothorax. Calcification of the aorta.  IMPRESSION: B prominent perihilar structures consistent with lymphadenopathy seen on CT. No evidence of active pulmonary disease.   Electronically Signed   By: Burman Nieves M.D.   On: 08/17/2014 06:32   Dg Chest Port 1 View  08/19/2014   CLINICAL DATA:  Mucinous could be with biopsy on 08/17/2014. Abdominal swelling and 10 lb weight gain over the past 48 hr. Shortness of breath. Extremity swelling and nausea this a.m. History of pulmonary embolus.  EXAM: PORTABLE CHEST - 1 VIEW  COMPARISON:  08/17/2014   FINDINGS: Shallow inspiration with atelectasis in the lung bases new since previous study. Borderline heart size with prominent pulmonary vascularity suggesting developing vascular congestion. Prominent right perihilar structures again demonstrated consistent with lymphadenopathy seen on previous CT chest. No pneumothorax. No blunting of costophrenic angles.  IMPRESSION: Shallow inspiration with mild cardiac enlargement and mild developing pulmonary vascular congestion. Atelectasis in the lung bases.   Electronically Signed   By: Burman Nieves M.D.   On: 08/19/2014 06:09    ASSESSMENT AND PLAN: This is a very pleasant 69 years old white female who presented with hypermetabolic mediastinal lymphadenopathy with the final pathology consistent with sarcoidosis. I discussed the pathology results with the patient and recommended for her to keep her appointment with Dr. Kendrick Fries for evaluation and treatment of her condition The patient has no evidence for malignancy and I don't see a need for her to continue with routine follow-up visit with me at this point but will be happy to see her in the future if needed.  We will discharge her from the practice at this point. The patient voices understanding of current disease status and treatment options and is in agreement with the current care plan.  All questions were answered. The patient knows to call the clinic with any problems, questions or concerns. We can certainly see the patient much sooner if necessary.  Disclaimer: This note was dictated with voice recognition software. Similar sounding words can inadvertently be transcribed and may not be corrected upon review.

## 2014-09-13 LAB — FUNGUS CULTURE W SMEAR: Fungal Smear: NONE SEEN

## 2014-09-25 ENCOUNTER — Ambulatory Visit (INDEPENDENT_AMBULATORY_CARE_PROVIDER_SITE_OTHER): Payer: Medicare PPO | Admitting: Pulmonary Disease

## 2014-09-25 ENCOUNTER — Ambulatory Visit (INDEPENDENT_AMBULATORY_CARE_PROVIDER_SITE_OTHER)
Admission: RE | Admit: 2014-09-25 | Discharge: 2014-09-25 | Disposition: A | Discharge status: Home / Self Care | Payer: Medicare PPO | Source: Ambulatory Visit | Attending: Pulmonary Disease | Admitting: Pulmonary Disease

## 2014-09-25 ENCOUNTER — Encounter: Payer: Self-pay | Admitting: Pulmonary Disease

## 2014-09-25 VITALS — BP 126/68 | HR 88 | Ht 66.5 in | Wt 220.0 lb

## 2014-09-25 DIAGNOSIS — R05 Cough: Secondary | ICD-10-CM | POA: Diagnosis not present

## 2014-09-25 DIAGNOSIS — I2699 Other pulmonary embolism without acute cor pulmonale: Secondary | ICD-10-CM | POA: Diagnosis not present

## 2014-09-25 DIAGNOSIS — R059 Cough, unspecified: Secondary | ICD-10-CM

## 2014-09-25 DIAGNOSIS — D860 Sarcoidosis of lung: Secondary | ICD-10-CM | POA: Diagnosis not present

## 2014-09-25 MED ORDER — HYDROCHLOROTHIAZIDE 12.5 MG PO CAPS: 12.5000 mg | ORAL_CAPSULE | Freq: Every day | ORAL | Status: DC

## 2014-09-25 MED ORDER — LOSARTAN POTASSIUM 100 MG PO TABS: 100.0000 mg | ORAL_TABLET | Freq: Every day | ORAL | Status: DC

## 2014-09-25 MED ORDER — BENZONATATE 200 MG PO CAPS: 200.0000 mg | ORAL_CAPSULE | Freq: Three times a day (TID) | ORAL | Status: DC | PRN

## 2014-09-25 NOTE — Assessment & Plan Note (Signed)
I have reviewed the records from the surgery as well as reviewed the images from the CT angiogram of her chest from October 2015. 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 However, interestingly she keeps several birds in her home and her son had a very severe reaction to one of the bird several years ago. So before giving her a diagnosis of sarcoid, I want to see the results of the special stains which were performed on the lymph nodes.  If she had sarcoid, she really does not have much in the way of symptoms from it and so I do not see an indication for treatment at this time.  Plan: -Annual eye exams -I will request the results of the special stains from the lymph nodes  -follow-up in 4 weeks to discuss the results of pathology

## 2014-09-25 NOTE — Assessment & Plan Note (Signed)
She had an idiopathic pulmonary embolism in 2012. At this time there is no clear evidence of what could've led to this.   Plan: -Lifelong anticoagulation with Eliquis

## 2014-09-25 NOTE — Progress Notes (Addendum)
Subjective:    Patient ID: Melanie Pittman, female    DOB: 05-12-1946, 69 y.o.   MRN: [id number]  HPI  Chief Complaint  Patient presents with  . Advice Only    Referred by Dr. Dorris Fetch for sarcoidosis.    This is a very pleasant 69 year old female who comes to my clinic today for evaluation of likely sarcoidosis. She is a lifelong nonsmoker and in October 2015 she had a pulmonary embolism. Prior to that she had been very active active with regular exercise and had no long plane trips, or hospitalizations. In fact, the week prior to her pulmonary embolism she said the only thing that she did differently was go on an exceptionally long walk with her church members. She said that not long after the walk she started to develop leg pain and swelling and then this led to shortness of breath so she went to the emergency department where she was found to have a large right-sided pulmonary embolism with mediastinal lymphadenopathy. She had a PET/CT done which was worrisome for malignancy so she underwent a mediastinoscopy by Dr. Dorris Fetch. 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 She has been referred to my clinic now for evaluation of likely sarcoidosis.  She says that since surgery she has been coughing heavily and has chest congestion. In general however she says she doesn't have much in the way of shortness of breath. She continues to have acid reflux as well as postnasal drip.  Past Medical History  Diagnosis Date  . GERD (gastroesophageal reflux disease)   . Hypertension   . Arthritis   . PE (pulmonary embolism)   . Sciatica   . Vertigo   . Depression   . Anxiety   . Meniere disease   . OSA (obstructive sleep apnea)   . Complication of anesthesia     had some type reaction to anesthesia at dentist office and was brought to ED. See notes 04/11/14  . Shortness of breath dyspnea     with exertion  . Blood dyscrasia     bruises easily     Family History   Problem Relation Age of Onset  . Colon polyps Father   . Arthritis Father   . Prostate cancer Father   . Heart disease Father   . Hyperlipidemia Father   . Hypertension Mother   . Cancer Mother     lung,breast  . Heart disease Sister   . Kidney disease Sister   . Cancer Sister     ovarian  . Kidney disease Son   . Diabetes Paternal Uncle   . Diabetes Cousin   . Stroke Other      History   Social History  . Marital Status: Divorced    Spouse Name: N/A  . Number of Children: 4  . Years of Education: BA   Occupational History  .     Social History Main Topics  . Smoking status: Never Smoker   . Smokeless tobacco: Never Used     Comment: parents smoked growing up.   . Alcohol Use: 0.0 oz/week    0 Standard drinks or equivalent per week     Comment: wine 1-2 occasionally  . Drug Use: No  . Sexual Activity: Not on file   Other Topics Concern  . Not on file   Social History Narrative   Patient lives at home alone.    Patient has her BA degree.    Patient is single.  Patient patient does not work.    Patient has 4 grown adult children.      Allergies  Allergen Reactions  . Peanuts [Peanut Oil] Swelling    'all nuts" -chest tightness  . Nutmeg Oil (Myristica Oil) Itching  . Hydrocodone Palpitations  . Other Rash    Pre-surgical wash  . Penicillins Rash  . Prednisone Palpitations     Outpatient Prescriptions Prior to Visit  Medication Sig Dispense Refill  . apixaban (ELIQUIS) 5 MG TABS tablet Take 1 tablet (5 mg total) by mouth 2 (two) times daily. 72 tablet 0  . fish oil-omega-3 fatty acids 1000 MG capsule Take 1 g by mouth daily.     . Multiple Vitamin (MULTIVITAMIN WITH MINERALS) TABS Take 1 tablet by mouth daily.    . pantoprazole (PROTONIX) 40 MG tablet Take 1 tablet by mouth daily.  0  . furosemide (LASIX) 20 MG tablet Take 1 tablet (20 mg total) by mouth 2 (two) times daily between meals as needed for fluid. (Patient not taking: Reported on  09/25/2014) 14 tablet 1  . lisinopril-hydrochlorothiazide (PRINZIDE,ZESTORETIC) 20-12.5 MG per tablet Take 1 tablet by mouth daily.     No facility-administered medications prior to visit.        Review of Systems  Constitutional: Negative for fever and unexpected weight change.  HENT: Negative for congestion, dental problem, ear pain, nosebleeds, postnasal drip, rhinorrhea, sinus pressure, sneezing, sore throat and trouble swallowing.   Eyes: Negative for redness and itching.  Respiratory: Positive for apnea, cough, chest tightness and shortness of breath. Negative for wheezing.   Cardiovascular: Negative for palpitations and leg swelling.  Gastrointestinal: Negative for nausea and vomiting.  Genitourinary: Negative for dysuria.  Musculoskeletal: Negative for joint swelling.  Skin: Negative for rash.  Neurological: Negative for headaches.  Hematological: Does not bruise/bleed easily.  Psychiatric/Behavioral: Negative for dysphoric mood. The patient is not nervous/anxious.        Objective:   Physical Exam Filed Vitals:   09/25/14 1428  BP: 126/68  Pulse: 88  Height: 5' 6.5" (1.689 m)  Weight: 220 lb (99.791 kg)  SpO2: 92%   RA  Gen: well appearing, no acute distress HEENT: NCAT, PERRL, EOMi, OP clear, neck supple without masses PULM: CTA B CV: RRR, no mgr, no JVD AB: BS+, soft, nontender, no hsm Ext: warm, no edema, no clubbing, no cyanosis Derm: no rash or skin breakdown Neuro: A&Ox4, CN II-XII intact, strength 5/5 in all 4 extremities  06/05/2014 CT angiogram chest> pulmonary embolism of the right lung noted, RV strain, mediastinal lymphadenopathy as well as what is likely a intrapulmonary lymph node October 2015 echocardiogram> LVH, LVEF 55%, normal RV January 2015 mediastinoscopy> nonnecrotizing granuloma seen in all lymph nodes biopsied January 2016 EKG> normal sinus rhythm, right bundle branch block and left anterior fascicular block noted      Assessment &  Plan:   Sarcoidosis of lung I have reviewed the records from the surgery as well as reviewed the images from the CT angiogram of her chest from October 2015. 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 However, interestingly she keeps several birds in her home and her son had a very severe reaction to one of the bird several years ago. So before giving her a diagnosis of sarcoid, I want to see the results of the special stains which were performed on  the lymph nodes.  If she had sarcoid, she really does not have much in the way of symptoms from it and so I do not see an indication for treatment at this time.  Plan: -Annual eye exams -I will request the results of the special stains from the lymph nodes  -follow-up in 4 weeks to discuss the results of pathology   Pulmonary embolism She had an idiopathic pulmonary embolism in 2012. At this time there is no clear evidence of what could've led to this.   Plan: -Lifelong anticoagulation with Eliquis   Cough She has a cough which I hope is just due to acid reflux as well as postnasal drip which are both poorly controlled. I explained to her that I would prefer to try to control it by treating these problems rather than using steroids because I really don't think she has significant sarcoid involvement in her lungs.  Further, the ACE inhibitor may be contributing.  Plan:  -change lisinopril to losartan. -Treat acid reflux, lifestyle modifications were reviewed, encouraged to take pantoprazole twice a day -Treat sinus congestion, advised to use saline rinses as well as chlorpheniramine -Use Tessalon as needed for cough     Updated Medication List Outpatient Encounter Prescriptions as of 09/25/2014  Medication Sig  . apixaban (ELIQUIS) 5 MG TABS tablet Take 1 tablet (5 mg  total) by mouth 2 (two) times daily.  . benzonatate (TESSALON) 200 MG capsule Take 1 capsule (200 mg total) by mouth 3 (three) times daily as needed for cough.  . fish oil-omega-3 fatty acids 1000 MG capsule Take 1 g by mouth daily.   . hydrochlorothiazide (MICROZIDE) 12.5 MG capsule Take 1 capsule (12.5 mg total) by mouth daily.  Marland Kitchen losartan (COZAAR) 100 MG tablet Take 1 tablet (100 mg total) by mouth daily.  . Multiple Vitamin (MULTIVITAMIN WITH MINERALS) TABS Take 1 tablet by mouth daily.  . pantoprazole (PROTONIX) 40 MG tablet Take 1 tablet by mouth daily.  . [DISCONTINUED] furosemide (LASIX) 20 MG tablet Take 1 tablet (20 mg total) by mouth 2 (two) times daily between meals as needed for fluid. (Patient not taking: Reported on 09/25/2014)  . [DISCONTINUED] lisinopril-hydrochlorothiazide (PRINZIDE,ZESTORETIC) 20-12.5 MG per tablet Take 1 tablet by mouth daily.

## 2014-09-25 NOTE — Patient Instructions (Signed)
Stop taking the lisinopril-HCTZ Start taking HCTZ 12.5mg  and Losartan 100mg  daily Take tessalon as needed for cough Use mucinex OTC for chest congestion We will call you with the results of the CXR Take prilosec twice a day and follow the GERD lifestyle modifications we gave you  Take chlorpheniramine as needed for sinus congestion We will see you back in 2-4 weeks or sooner if needed

## 2014-09-25 NOTE — Assessment & Plan Note (Addendum)
She has a cough which I hope is just due to acid reflux as well as postnasal drip which are both poorly controlled. I explained to her that I would prefer to try to control it by treating these problems rather than using steroids because I really don't think she has significant sarcoid involvement in her lungs.  Further, the ACE inhibitor may be contributing.  Plan:  -change lisinopril to losartan. -Treat acid reflux, lifestyle modifications were reviewed, encouraged to take pantoprazole twice a day -Treat sinus congestion, advised to use saline rinses as well as chlorpheniramine -Use Tessalon as needed for cough

## 2014-09-28 NOTE — Progress Notes (Signed)
Quick Note:  lmtcb X1 to make pt aware of results ______

## 2014-09-29 ENCOUNTER — Telehealth: Payer: Self-pay | Admitting: Pulmonary Disease

## 2014-09-29 LAB — AFB CULTURE WITH SMEAR (NOT AT ARMC): ACID FAST SMEAR: NONE SEEN

## 2014-09-29 NOTE — Telephone Encounter (Signed)
Called and spoke to pt. Informed pt of the results per BQ. Pt verbalized understanding and denied any further questions or concerns at this time.    Notes Recorded by Lupita Leashouglas B McQuaid, MD on 09/26/2014 at 6:33 AM  Morrie SheldonAshley,  Please let her know that there is nothing seen on the chest x-ray which would explain her cough.  Thanks, Kipp BroodBrent

## 2014-09-29 NOTE — Progress Notes (Signed)
Quick Note:  Called and spoke to pt. Informed pt of the results per BQ. Pt verbalized understanding and denied any further questions or concerns at this time.   ______ 

## 2014-11-05 ENCOUNTER — Ambulatory Visit (INDEPENDENT_AMBULATORY_CARE_PROVIDER_SITE_OTHER): Payer: Medicare PPO | Admitting: Pulmonary Disease

## 2014-11-05 ENCOUNTER — Ambulatory Visit: Payer: Medicare PPO | Admitting: Pulmonary Disease

## 2014-11-05 ENCOUNTER — Encounter: Payer: Self-pay | Admitting: Pulmonary Disease

## 2014-11-05 VITALS — BP 134/80 | HR 83 | Ht 66.5 in | Wt 222.0 lb

## 2014-11-05 DIAGNOSIS — I2699 Other pulmonary embolism without acute cor pulmonale: Secondary | ICD-10-CM

## 2014-11-05 DIAGNOSIS — R059 Cough, unspecified: Secondary | ICD-10-CM

## 2014-11-05 DIAGNOSIS — D860 Sarcoidosis of lung: Secondary | ICD-10-CM | POA: Diagnosis not present

## 2014-11-05 DIAGNOSIS — R05 Cough: Secondary | ICD-10-CM | POA: Diagnosis not present

## 2014-11-05 NOTE — Progress Notes (Signed)
Subjective:    Patient ID: Melanie Pittman, female    DOB: 13-May-1946, 69 y.o.   MRN: [id number]  Synopsis: Melanie Pittman wsa referred to [facility] in 2016 after she had an uprovoked PE and was found to have mediastinal lymphadenopathy.  She had a mediastinoscopy which showed non-caseating granulomas and special stains were negative for evidence of infection.  HPI Chief Complaint  Patient presents with  . Follow-up    Pt states her cough has improved but not gone completely.  Also states that she is sensitive to scents, makeup, etc.     Melanie Pittman states that she has been breathing comfortably since the last visit but she still gets irritation from fine powders or fumes which tend to make her choke and cough.  She thinks that the pollen contributes to this as well.  In general the cough is getting better.  She has noticed that her skin is more sensitive as well.  She is getting a rash more frequently.  She says her acid reflux is better and her cough is better, but she still has acid reflux at night.  She has some fatigue but no dyspnea.    Past Medical History  Diagnosis Date  . GERD (gastroesophageal reflux disease)   . Hypertension   . Arthritis   . PE (pulmonary embolism)   . Sciatica   . Vertigo   . Depression   . Anxiety   . Meniere disease   . OSA (obstructive sleep apnea)   . Complication of anesthesia     had some type reaction to anesthesia at dentist office and was brought to ED. See notes 04/11/14  . Shortness of breath dyspnea     with exertion  . Blood dyscrasia     bruises easily      Review of Systems  Constitutional: Negative for fever, chills and fatigue.  HENT: Positive for postnasal drip and rhinorrhea. Negative for sinus pressure.   Respiratory: Positive for cough. Negative for shortness of breath and wheezing.   Cardiovascular: Negative for chest pain, palpitations and leg swelling.       Objective:   Physical Exam Filed Vitals:   11/05/14  1405  BP: 134/80  Pulse: 83  Height: 5' 6.5" (1.689 m)  Weight: 222 lb (100.699 kg)  SpO2: 99%   RA  Gen: well appearing, no acute distress HEENT: NCAT,EOMi, OP clear, PULM: CTA B CV: RRR, no mgr, no JVD AB: BS+, soft, nontender, Ext: warm, no edema, no clubbing, no cyanosis Derm: no rash or skin breakdown Neuro: A&Ox4, CN II-XII intact, MAEW  Pathology reports were reviewed again today in clinic     Assessment & Plan:   Cough This is due primarily to her acid reflux and the lisinopril which is now improving.  Plan: -continue GERD treatment, lifestyle modification -could use PPI daytime and H2 blocker night time   Pulmonary embolism Unprovoed, needs lifelong anticoagulation.   Sarcoidosis of lung Her special stains on the lymph node biopsy were negative.  She has no eye, kidney involvement or severe respiratory symptoms so I don't feel she needs treatment with prednisone.  Plan: -pft now and 6 months -CXR in 6 months -annual optho visits -annual EKG -f/u 6 months   > 25 minutes in direct consultation spent today discussing her diagnosis of sarcoird  Updated Medication List Outpatient Encounter Prescriptions as of 11/05/2014  Medication Sig  . apixaban (ELIQUIS) 5 MG TABS tablet Take 1 tablet (5 mg total) by  mouth 2 (two) times daily.  . fish oil-omega-3 fatty acids 1000 MG capsule Take 1 g by mouth daily.   . hydrochlorothiazide (MICROZIDE) 12.5 MG capsule Take 1 capsule (12.5 mg total) by mouth daily.  Marland Kitchen. losartan (COZAAR) 100 MG tablet Take 1 tablet (100 mg total) by mouth daily.  . Multiple Vitamin (MULTIVITAMIN WITH MINERALS) TABS Take 1 tablet by mouth daily.  . pantoprazole (PROTONIX) 40 MG tablet Take 1 tablet by mouth daily.  . [DISCONTINUED] benzonatate (TESSALON) 200 MG capsule Take 1 capsule (200 mg total) by mouth 3 (three) times daily as needed for cough. (Patient not taking: Reported on 11/05/2014)

## 2014-11-05 NOTE — Assessment & Plan Note (Signed)
Her special stains on the lymph node biopsy were negative.  She has no eye, kidney involvement or severe respiratory symptoms so I don't feel she needs treatment with prednisone.  Plan: -pft now and 6 months -CXR in 6 months -annual optho visits -annual EKG -f/u 6 months

## 2014-11-05 NOTE — Assessment & Plan Note (Signed)
This is due primarily to her acid reflux and the lisinopril which is now improving.  Plan: -continue GERD treatment, lifestyle modification -could use PPI daytime and H2 blocker night time

## 2014-11-05 NOTE — Patient Instructions (Signed)
Take pepcid or zantac at night if the acid reflux doesn't get better See an eye doctor and tell them you have sarcoidosis We will arrange another pulmonary function test and a CXR on the next visit in 6 months

## 2014-11-05 NOTE — Assessment & Plan Note (Signed)
Unprovoed, needs lifelong anticoagulation.

## 2015-04-13 ENCOUNTER — Telehealth: Payer: Self-pay | Admitting: Pulmonary Disease

## 2015-04-13 MED ORDER — HYDROCHLOROTHIAZIDE 12.5 MG PO CAPS: 12.5000 mg | ORAL_CAPSULE | Freq: Every day | ORAL | Status: AC

## 2015-04-13 NOTE — Telephone Encounter (Signed)
OK to refill

## 2015-04-13 NOTE — Telephone Encounter (Signed)
RX has been sent in. Nothing further needed 

## 2015-04-13 NOTE — Telephone Encounter (Signed)
Pt is requesting a refill on HCTZ 12.5 mg Last refilled 09/25/14 by Dr. Kendrick Fries Once a day #30 x 5 refills Pt has pending appt 05/31/15 with Dr. Kendrick Fries. Please advise if okay to refill? thanks

## 2015-04-13 NOTE — Telephone Encounter (Signed)
lmtcb

## 2015-05-31 ENCOUNTER — Ambulatory Visit: Payer: Medicare PPO | Admitting: Pulmonary Disease

## 2015-07-27 ENCOUNTER — Ambulatory Visit (INDEPENDENT_AMBULATORY_CARE_PROVIDER_SITE_OTHER): Payer: Medicare PPO | Admitting: Pulmonary Disease

## 2015-07-27 ENCOUNTER — Encounter: Payer: Self-pay | Admitting: Pulmonary Disease

## 2015-07-27 VITALS — BP 136/78 | HR 82 | Ht 66.5 in | Wt 228.0 lb

## 2015-07-27 DIAGNOSIS — I2699 Other pulmonary embolism without acute cor pulmonale: Secondary | ICD-10-CM | POA: Diagnosis not present

## 2015-07-27 DIAGNOSIS — D860 Sarcoidosis of lung: Secondary | ICD-10-CM

## 2015-07-27 NOTE — Assessment & Plan Note (Signed)
She had sarcoidosis diagnosed based on a mediastinal lymph node biopsy with special stains all negative. She never needed treatment with prednisone because she's never had convincing evidence of severe end organ damage or significant symptoms from the sarcoid. She has not gone to an ophthalmologist despite my recommendation. [place] [facility] [place] and is requesting the name of a pulmonologist locally.  Plan: I recommend that she establish care [place]. I have given her the name of my friend Dr. Stacie GlazeHili Metjian No indication for prednisone at this time Annual ophthalmology visits recommended

## 2015-07-27 NOTE — Progress Notes (Signed)
Subjective:    Patient ID: Melanie Pittman, female    DOB: 1945-11-13, 69 y.o.   MRN: [id number]  Synopsis: Ms. Melanie Pittman wsa referred to [facility] in 2016 after she had an uprovoked PE and was found to have mediastinal lymphadenopathy.  She had a mediastinoscopy which showed non-caseating granulomas and special stains were negative for evidence of infection.  HPI Chief Complaint  Patient presents with  . Follow-up    pt no-show'ed for PFT today.  Pt c/o increased sensitivity to smells, difficulty breathing with any strong smells.  notes some prolonged sinus congestion.     Melanie Pittman states that she has been breathing comfortably since the last visit but she still gets irritation from fine powders or fumes which tend to make her choke and cough.  She thinks that the pollen contributes to this as well.  In general the cough is getting better.  She has noticed that her skin is more sensitive as well.  She is getting a rash more frequently.  She says her acid reflux is better and her cough is better, but she still has acid reflux at night.  She has some fatigue but no dyspnea.    Past Medical History  Diagnosis Date  . GERD (gastroesophageal reflux disease)   . Hypertension   . Arthritis   . PE (pulmonary embolism)   . Sciatica   . Vertigo   . Depression   . Anxiety   . Meniere disease   . OSA (obstructive sleep apnea)   . Complication of anesthesia     had some type reaction to anesthesia at dentist office and was brought to ED. See notes 04/11/14  . Shortness of breath dyspnea     with exertion  . Blood dyscrasia     bruises easily      Review of Systems  Constitutional: Negative for fever, chills and fatigue.  HENT: Positive for postnasal drip and rhinorrhea. Negative for sinus pressure.   Respiratory: Positive for cough. Negative for shortness of breath and wheezing.   Cardiovascular: Negative for chest pain, palpitations and leg swelling.       Objective:   Physical Exam Filed Vitals:   07/27/15 1300  BP: 136/78  Pulse: 82  Height: 5' 6.5" (1.689 m)  Weight: 228 lb (103.42 kg)  SpO2: 98%   RA  Gen: well appearing, no acute distress HEENT: NCAT,EOMi, OP clear, PULM: CTA B CV: RRR, no mgr, no JVD AB: BS+, soft, nontender, Ext: warm, no edema, no clubbing, no cyanosis Derm: no rash or skin breakdown Neuro: A&Ox4, CN II-XII intact, MAEW  Pathology reports were reviewed again today in clinic     Assessment & Plan:   Pulmonary embolism She had a pulmonary embolism in October 2015. She says that this occurred after she took and especially long walk with her church (6 miles). It's never been entirely clear if that somehow provoked or pulmonary embolism. She has now been treated for over one year with Eliquis and is experiencing which she believes to be side effects from the Eliquis with hair loss. She stopped taking Eliquis approximately one month ago and she says that her hair is starting to grow back. Today we talked for a very long time about the lifetime risk of recurrent pulmonary embolism. I stated that some guidelines recommend that people with idiopathic pulmonary embolism be treated with anticoagulation as long as possible. She is somewhat hesitant to restart anticoagulation.  Plan: Referral for a second opinion  with a hematologist at [facility] to discuss the need for lifetime anticoagulation Remain off of Eliquis for now If she develops leg pain or shortness of breath she is to go get medical care immediately  Sarcoidosis of lung She had sarcoidosis diagnosed based on a mediastinal lymph node biopsy with special stains all negative. She never needed treatment with prednisone because she's never had convincing evidence of severe end organ damage or significant symptoms from the sarcoid. She has not gone to an ophthalmologist despite my recommendation. She recently relocated to [facility] and is requesting the name of a  pulmonologist locally.  Plan: I recommend that she establish care with a pulmonologist in [place]. I have given her the name of my friend Dr. Stacie Glaze Metjian No indication for prednisone at this time Annual ophthalmology visits recommended  > 25 minutes on today's visit, > 50% in direct consultation  Updated Medication List Outpatient Encounter Prescriptions as of 07/27/2015  Medication Sig  . cyanocobalamin 1000 MCG tablet Take 100 mcg by mouth daily.  . hydrochlorothiazide (MICROZIDE) 12.5 MG capsule Take 1 capsule (12.5 mg total) by mouth daily.  Marland Kitchen losartan (COZAAR) 100 MG tablet Take 1 tablet (100 mg total) by mouth daily.  . Multiple Vitamin (MULTIVITAMIN WITH MINERALS) TABS Take 1 tablet by mouth daily.  . vitamin C (ASCORBIC ACID) 500 MG tablet Take 1,500 mg by mouth daily.  . [DISCONTINUED] fish oil-omega-3 fatty acids 1000 MG capsule Take 1 g by mouth daily.   . [DISCONTINUED] apixaban (ELIQUIS) 5 MG TABS tablet Take 1 tablet (5 mg total) by mouth 2 (two) times daily.  . [DISCONTINUED] pantoprazole (PROTONIX) 40 MG tablet Take 1 tablet by mouth daily.   No facility-administered encounter medications on file as of 07/27/2015.

## 2015-07-27 NOTE — Assessment & Plan Note (Signed)
She had a pulmonary embolism in October 2015. She says that this occurred after she took and especially long walk with her church (6 miles). It's never been entirely clear if that somehow provoked or pulmonary embolism. She has now been treated for over one year with Eliquis and is experiencing which she believes to be side effects from the Eliquis with hair loss. She stopped taking Eliquis approximately one month ago and she says that her hair is starting to grow back. Today we talked for a very long time about the lifetime risk of recurrent pulmonary embolism. I stated that some guidelines recommend that people with idiopathic pulmonary embolism be treated with anticoagulation as long as possible. She is somewhat hesitant to restart anticoagulation.  Plan: Referral for a second opinion with a [facility] to discuss the need for lifetime anticoagulation Remain off of Eliquis for now If she develops leg pain or shortness of breath she is to go get medical care immediately

## 2015-07-27 NOTE — Patient Instructions (Signed)
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 [name] to establish care [place]. Dr. [name] MD ?  Directions  Pulmonologist  [facility]  Address: [phone] [facility]  [phone]  Stop taking Eliquis for now, but if you develop leg pain or shortness of breath go to a hospital immediately  We will refer you to Dr. 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 Dr. [name], MD ?   Website  Directions  Hematologist  Address: [address], [place], [place] [address]  Phone: [phone]    Dr. [name], MD ?   Website  Directions  Hematologist  Address: [address], [place], [place] [address]  Phone: [phone]

## 2015-08-24 IMAGING — CR DG LUMBAR SPINE COMPLETE 4+V
5 series · 5 of 5 positions shown · non-contrast
Comparison: None.

CLINICAL DATA: Bilateral leg numbness for 2 days

EXAM:
LUMBAR SPINE - COMPLETE 4+ VIEW

[t lumbar spine ap]
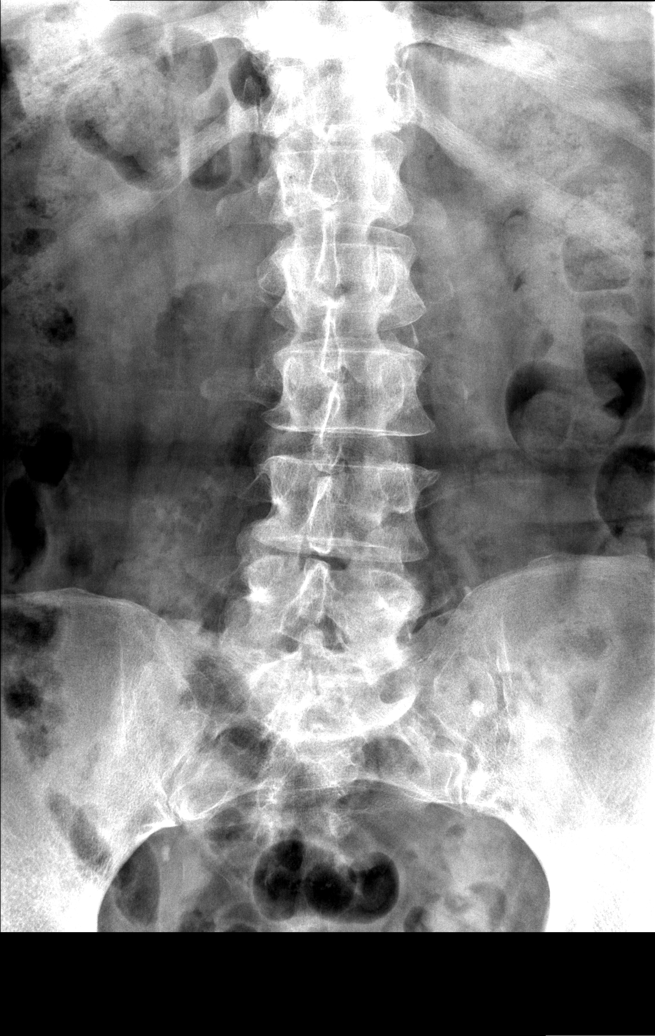

[t lumbar spine obl (1 of 2)]
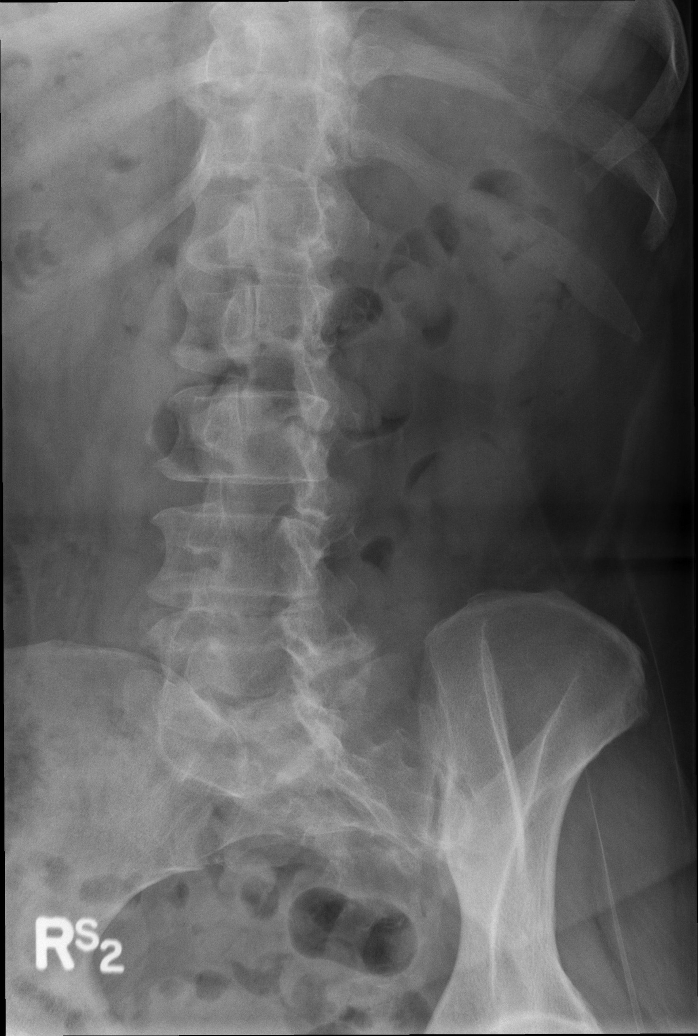

[t lumbar spine obl (2 of 2)]
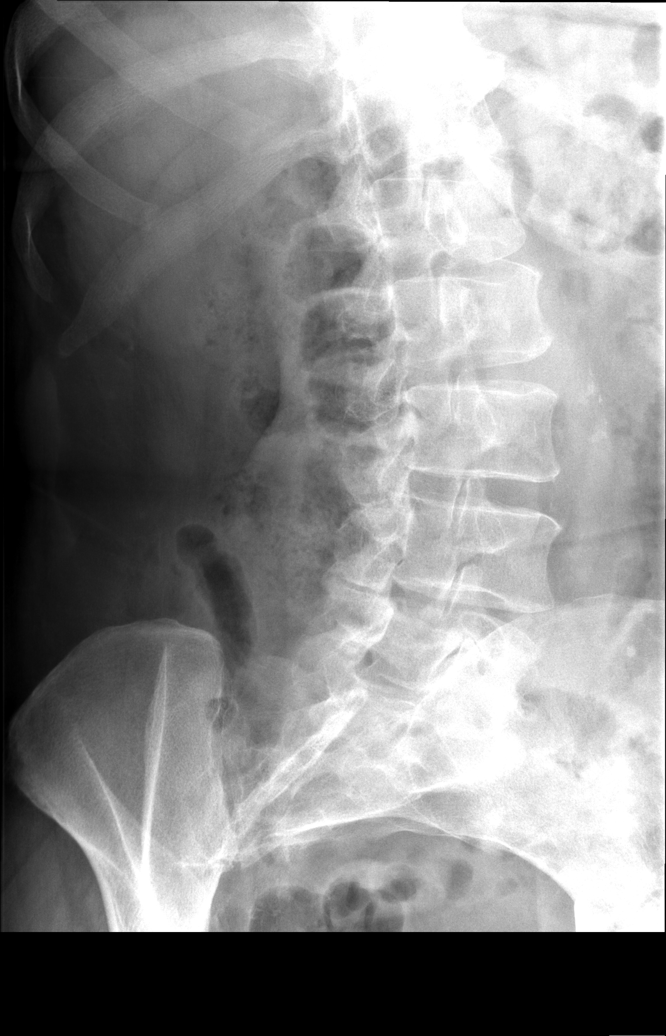

[t lumbar spine lat]
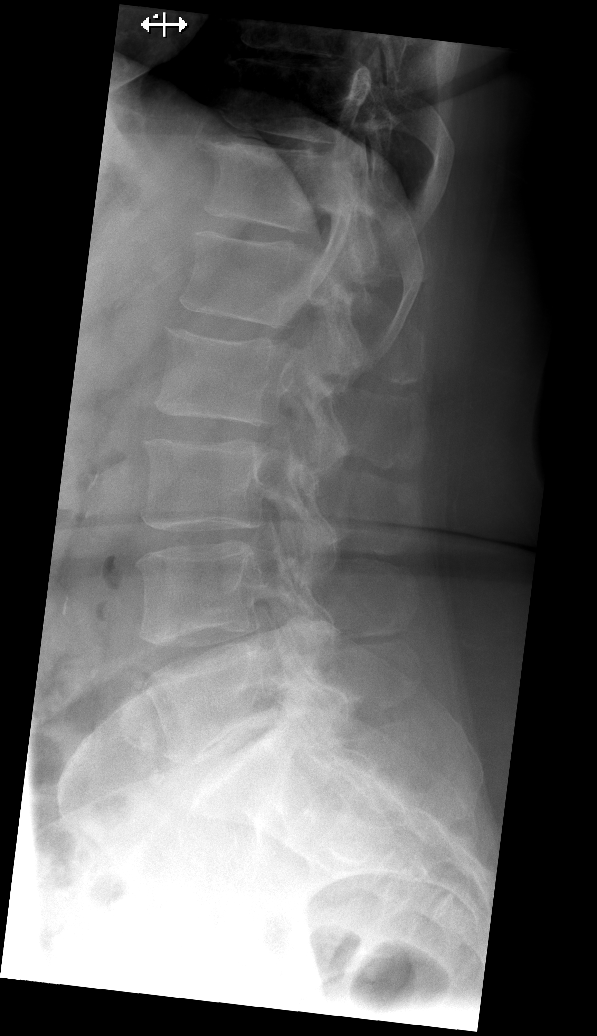

[t lumbar l-5 s-1 spot]
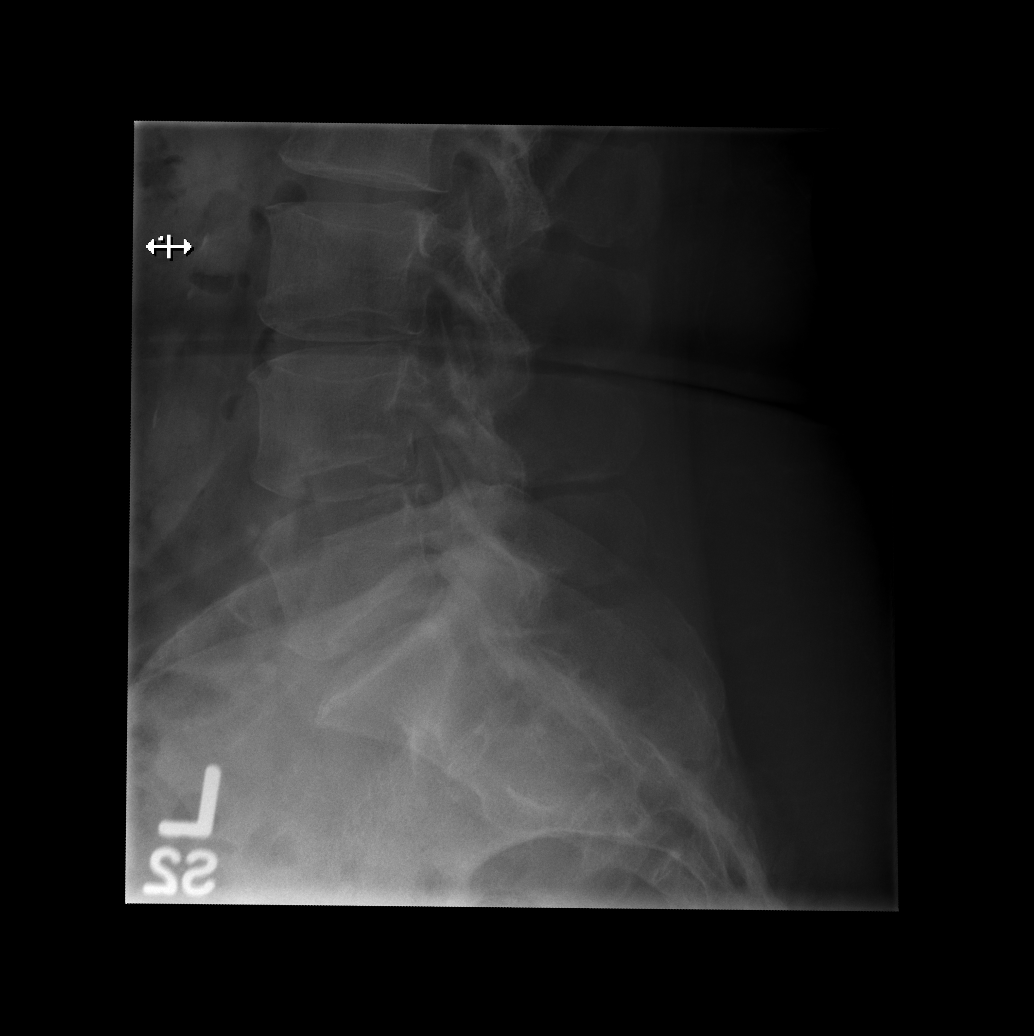

[5 of 5 positions shown; findings below may reference images not displayed]

FINDINGS: Five lumbar type vertebral bodies are noted. Vertebral body height
is well maintained. Mild osteophytic changes are seen. No pars
defects are noted. No spondylolisthesis is seen. Mild aortic
calcifications are noted.
IMPRESSION: Mild degenerative change without acute abnormality.

## 2015-09-14 ENCOUNTER — Telehealth: Payer: Self-pay | Admitting: Pulmonary Disease

## 2015-09-14 NOTE — Telephone Encounter (Signed)
Called spoke with pt. 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 She reports it feels like sand in her eyeballs when she tries to close them at night to sleep. They are red and runny as well. Reports it is both eyes. She has been using sterile eye drops (unsure the name of it). Pt wants to know if Dr. Kendrick Fries can call in something until she can see the eye specialists at [facility] since she feels it is related to her sarcoid? Please advise Dr. Kendrick Fries thanks   --pt aware BQ off this afternoon and is fine with a call back tomorrow.

## 2015-09-15 NOTE — Telephone Encounter (Signed)
Spoke with the pt and notified of recs per BQ  She verbalized understanding and nothing further needed 

## 2015-09-15 NOTE — Telephone Encounter (Signed)
I really wouldn't know what to call in Needs to be seen by PCP or another opthalmologist who can see her sooner

## 2015-09-30 ENCOUNTER — Other Ambulatory Visit: Payer: Self-pay | Admitting: Pulmonary Disease

## 2015-11-01 IMAGING — CR DG CHEST 2V
2 series · 2 of 2 positions shown · non-contrast
Comparison: Chest 06/05/2014.  PET-CT 07/14/2014.

CLINICAL DATA: Preoperative. Mediastinal lymphadenopathy. Shortness
of breath.

EXAM:
CHEST  2 VIEW

[w chest pa]
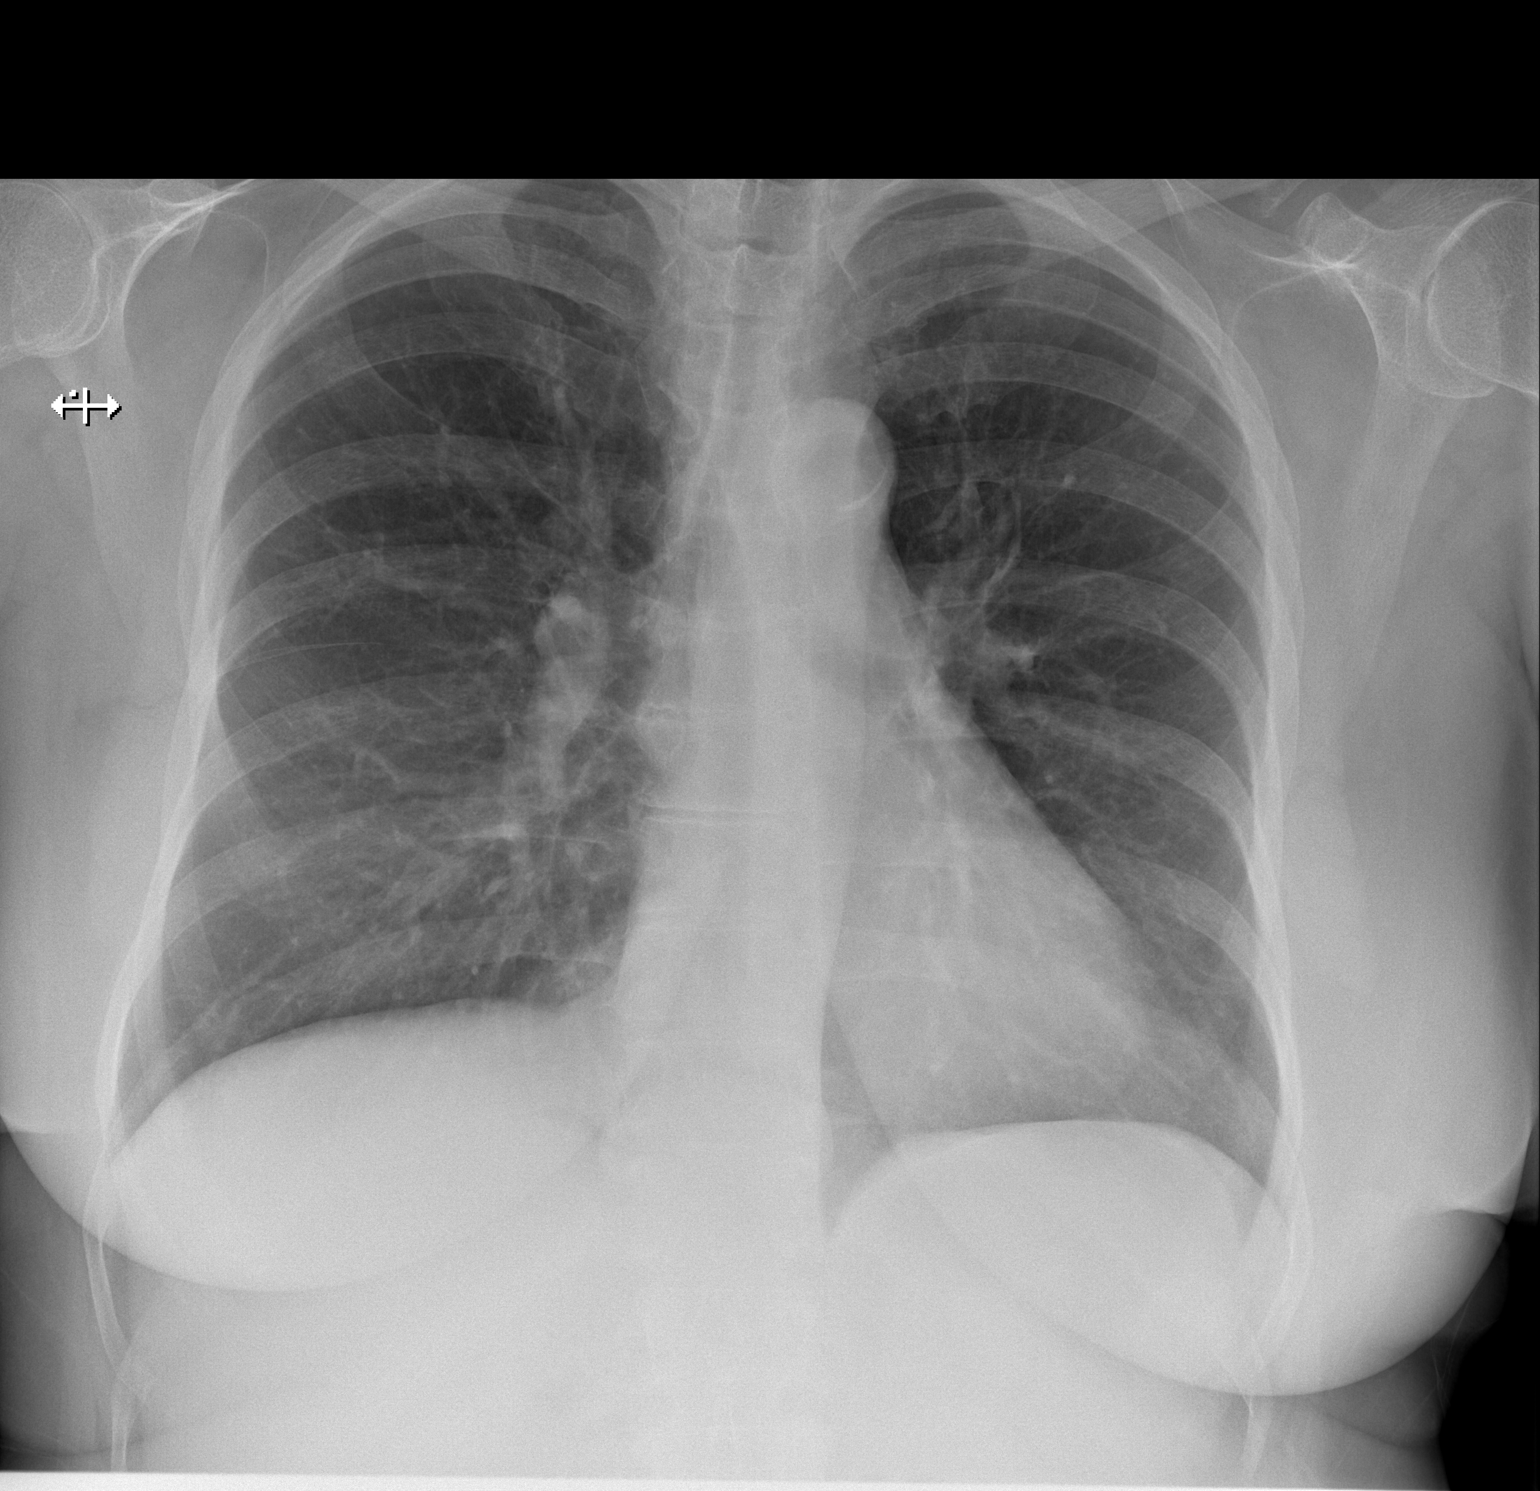

[w chest lat]
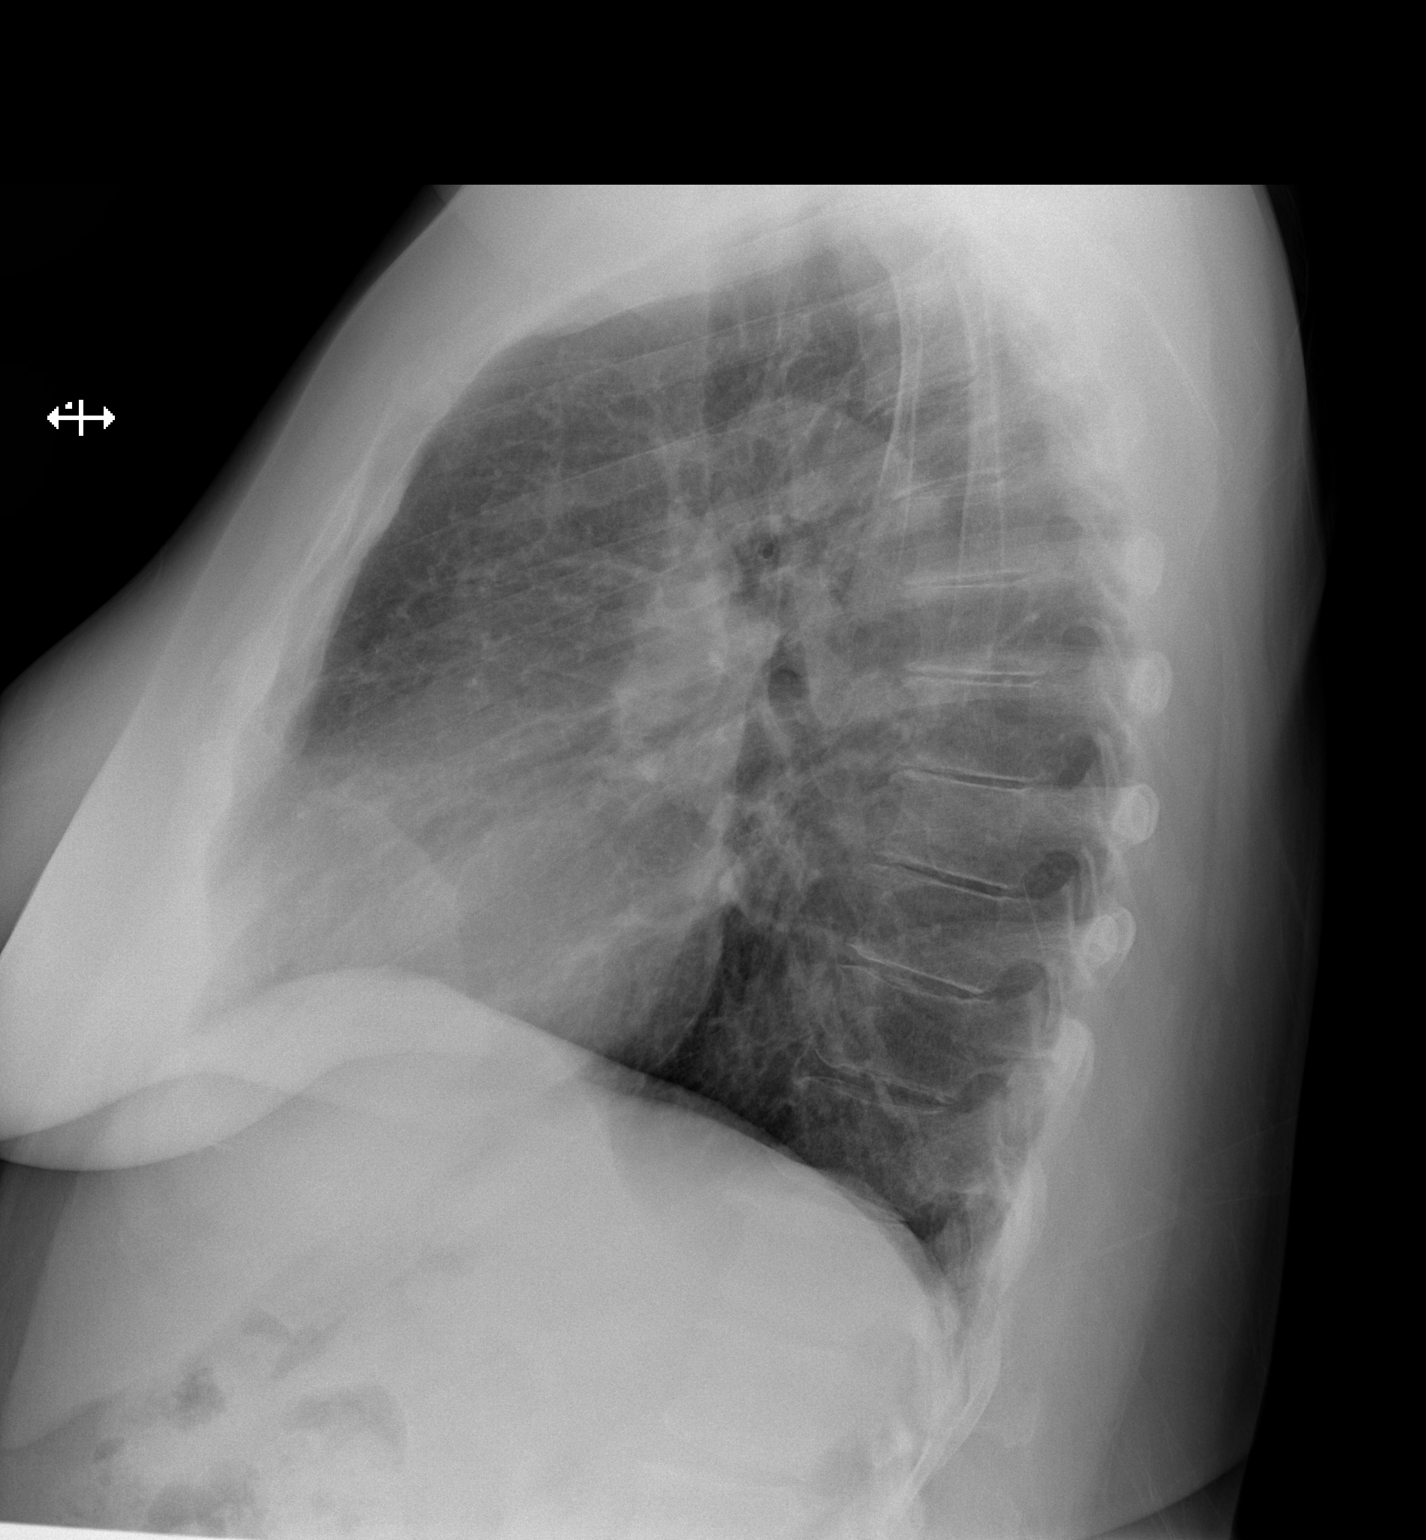

[2 of 2 positions shown; findings below may reference images not displayed]

FINDINGS: Normal heart size and pulmonary vascularity. Unchanged prominence of
hilar structures bilaterally, likely corresponding to
lymphadenopathy seen on previous chest CT. No focal airspace disease
in the lungs. No blunting of costophrenic angles. No pneumothorax.
Calcification of the aorta.
IMPRESSION: B prominent perihilar structures consistent with lymphadenopathy
seen on CT. No evidence of active pulmonary disease.

## 2015-11-03 ENCOUNTER — Other Ambulatory Visit: Payer: Self-pay | Admitting: Pulmonary Disease

## 2016-05-15 ENCOUNTER — Other Ambulatory Visit: Payer: Self-pay | Admitting: Pharmacist

## 2016-05-15 NOTE — Patient Outreach (Signed)
Outreach call to Melanie Pittman regarding her request for follow up from the [facility]. Left a HIPAA compliant message on the patient's voicemail.  [name], PharmD Clinical Pharmacist [facility] Care Management [phone]
